# Patient Record
Sex: Male | Born: 1985 | Race: White | Hispanic: Yes | Marital: Married | Smoking: Former smoker
Health system: Southern US, Community
[De-identification: ages and names within clinical notes are randomized; demographics above are authoritative.]

## PROBLEM LIST (undated history)

## (undated) ENCOUNTER — Ambulatory Visit

## (undated) DIAGNOSIS — B699 Cysticercosis, unspecified: Secondary | ICD-10-CM

## (undated) HISTORY — DX: Cysticercosis, unspecified: B69.9

---

## 2005-10-19 ENCOUNTER — Emergency Department (HOSPITAL_COMMUNITY): Admission: EM | Admit: 2005-10-19 | Discharge: 2005-10-19 | Payer: Self-pay | Admitting: Emergency Medicine

## 2009-03-21 ENCOUNTER — Emergency Department (HOSPITAL_COMMUNITY): Admission: EM | Admit: 2009-03-21 | Discharge: 2009-03-22 | Payer: Self-pay | Admitting: Emergency Medicine

## 2009-03-26 ENCOUNTER — Ambulatory Visit: Payer: Self-pay | Admitting: Family Medicine

## 2009-03-26 ENCOUNTER — Ambulatory Visit: Payer: Self-pay | Admitting: Infectious Diseases

## 2009-03-26 ENCOUNTER — Inpatient Hospital Stay (HOSPITAL_COMMUNITY): Admission: EM | Admit: 2009-03-26 | Discharge: 2009-03-29 | Payer: Self-pay | Admitting: Family Medicine

## 2009-03-26 ENCOUNTER — Encounter: Payer: Self-pay | Admitting: Family Medicine

## 2009-03-26 DIAGNOSIS — B699 Cysticercosis, unspecified: Secondary | ICD-10-CM | POA: Insufficient documentation

## 2009-04-18 ENCOUNTER — Ambulatory Visit: Payer: Self-pay | Admitting: Infectious Diseases

## 2010-07-03 ENCOUNTER — Emergency Department (HOSPITAL_COMMUNITY): Admission: EM | Admit: 2010-07-03 | Discharge: 2010-07-04 | Payer: Self-pay | Admitting: Emergency Medicine

## 2010-07-18 ENCOUNTER — Encounter: Admission: RE | Admit: 2010-07-18 | Discharge: 2010-07-18 | Payer: Self-pay | Admitting: Emergency Medicine

## 2010-12-31 ENCOUNTER — Encounter: Payer: Self-pay | Admitting: Emergency Medicine

## 2011-01-10 NOTE — Assessment & Plan Note (Signed)
Summary: hsfu need chart neurocystercercosis   Vital Signs:  Patient profile:   25 year old male Height:      65 inches (165.10 cm) Weight:      149.5 pounds (67.95 kg) BMI:     24.97 Temp:     97.1 degrees F (36.17 degrees C) oral Pulse rate:   61 / minute BP sitting:   118 / 70  (right arm)  Vitals Entered By: Baxter Hire) (Apr 18, 2009 11:49 AM) CC: hsfu Is Patient Diabetic? No Pain Assessment Patient in pain? no      Nutritional Status BMI of 19 -24 = normal Nutritional Status Detail good  Have you ever been in a relationship where you felt threatened, hurt or afraid?Unable to ask   Does patient need assistance? Functional Status Self care Ambulation Normal   CC:  hsfu.  History of Present Illness: 25 yo M  presented to the [facility]  on March 26, 2009 with an 8 day history of numbness in the left side of his body.  He had no fever or chills.  He did have a frontal headache but no change in his vision.  He also noted a nonspecific change in his strength at that time.  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  He had a PPD placed that was negative. He serum cysticercosis antibodies were negative. he was started on albendazole as well as dexamethasone.  He had a HIV test which was non-reactive.  He was seen by neurology and started on anti-seizure prophylaxis.  He completed 10 days of albendazole and is now here in infectious disease clinic for follow-up.  Today states that he has no further numbness (this resolved in the hospital).  No further headache. He has completed his his steroids and his albendazole and remains only on tegretol.    Preventive Screening-Counseling & Management     Alcohol drinks/day: 0     Smoking Status: never     Caffeine use/day: 4 glasses of sweet tea per day     Does Patient Exercise: yes     Type of exercise: walks alot at work     Exercise (avg: min/session): >60     Times/week: 5     Seat Belt Use:  yes      Drug Use:  no.    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  Social History:    Never Smoked    Alcohol use-no    Drug use-no    grew up in [place], in [place] since [place].     Occupation: Holiday representative.     Drug Use:  no  Review of Systems       no problems with vision, has returned to work.   Physical Exam  General:  well-developed, well-nourished, and well-hydrated.   Eyes:  pupils equal, pupils round, and pupils reactive to  light.   Mouth:  pharynx pink and moist and no exudates.   Neck:  no masses.   Lungs:  normal respiratory effort and normal breath sounds.   Heart:  normal rate, regular rhythm, and no murmur.   Abdomen:  soft, non-tender, and normal bowel sounds.   Neurologic:  alert & oriented X3 and cranial nerves II-XII intact.     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 His signs suggested neurocysticersosis as did his radiographs. Would consider repeating his scans to asses the response to therapy. will defer this neuorology when he follows up with them, expect that he will have his tegretol d/c'ed then. I asked him to call if he has any further problems. Will see him back on a as needed.   Orders: Est. Patient Level III (04540)  Medications Added to Medication List This Visit: 1)  Tegretol 200 Mg Tabs (Carbamazepine) .... Take 1 tablet by mouth two times a day

## 2011-01-10 NOTE — Miscellaneous (Signed)
Summary: HIPAA Restrictions  HIPAA Restrictions   Imported By: Florinda Marker 04/18/2009 16:31:05  _____________________________________________________________________  External Attachment:    Type:   Image     Comment:   External Document

## 2011-02-24 LAB — CBC
HCT: 43.1 % (ref 39.0–52.0)
Hemoglobin: 14.8 g/dL (ref 13.0–17.0)
MCH: 30.8 pg (ref 26.0–34.0)
MCHC: 34.3 g/dL (ref 30.0–36.0)
MCV: 89.7 fL (ref 78.0–100.0)
Platelets: 261 10*3/uL (ref 150–400)
RBC: 4.81 MIL/uL (ref 4.22–5.81)
RDW: 12.8 % (ref 11.5–15.5)
WBC: 9.8 10*3/uL (ref 4.0–10.5)

## 2011-02-24 LAB — URINALYSIS, ROUTINE W REFLEX MICROSCOPIC
Bilirubin Urine: NEGATIVE
Glucose, UA: NEGATIVE mg/dL
Hgb urine dipstick: NEGATIVE
Ketones, ur: NEGATIVE mg/dL
Nitrite: NEGATIVE
Protein, ur: NEGATIVE mg/dL
Specific Gravity, Urine: 1.013 (ref 1.005–1.030)
Urobilinogen, UA: 0.2 mg/dL (ref 0.0–1.0)
pH: 7.5 (ref 5.0–8.0)

## 2011-02-24 LAB — DIFFERENTIAL
Basophils Absolute: 0 10*3/uL (ref 0.0–0.1)
Basophils Relative: 0 % (ref 0–1)
Eosinophils Absolute: 0 10*3/uL (ref 0.0–0.7)
Eosinophils Relative: 0 % (ref 0–5)
Lymphocytes Relative: 29 % (ref 12–46)
Lymphs Abs: 2.9 10*3/uL (ref 0.7–4.0)
Monocytes Absolute: 0.6 10*3/uL (ref 0.1–1.0)
Monocytes Relative: 6 % (ref 3–12)
Neutro Abs: 6.2 10*3/uL (ref 1.7–7.7)
Neutrophils Relative %: 64 % (ref 43–77)

## 2011-02-24 LAB — POCT I-STAT, CHEM 8
BUN: 10 mg/dL (ref 6–23)
Calcium, Ion: 1.17 mmol/L (ref 1.12–1.32)
Chloride: 103 mEq/L (ref 96–112)
Creatinine, Ser: 1 mg/dL (ref 0.4–1.5)
Glucose, Bld: 102 mg/dL — ABNORMAL HIGH (ref 70–99)
HCT: 46 % (ref 39.0–52.0)
Hemoglobin: 15.6 g/dL (ref 13.0–17.0)
Potassium: 3.3 mEq/L — ABNORMAL LOW (ref 3.5–5.1)
Sodium: 141 mEq/L (ref 135–145)
TCO2: 27 mmol/L (ref 0–100)

## 2011-03-21 LAB — COMPREHENSIVE METABOLIC PANEL
ALT: 17 U/L (ref 0–53)
AST: 24 U/L (ref 0–37)
Albumin: 4.1 g/dL (ref 3.5–5.2)
Alkaline Phosphatase: 63 U/L (ref 39–117)
BUN: 8 mg/dL (ref 6–23)
CO2: 28 mEq/L (ref 19–32)
Calcium: 8.9 mg/dL (ref 8.4–10.5)
Chloride: 102 mEq/L (ref 96–112)
Creatinine, Ser: 0.75 mg/dL (ref 0.4–1.5)
GFR calc Af Amer: >60 mL/min (ref >60)
GFR calc non Af Amer: >60 mL/min (ref >60)
Glucose, Bld: 98 mg/dL (ref 70–99)
Potassium: 3.6 mEq/L (ref 3.5–5.1)
Sodium: 136 mEq/L (ref 135–145)
Total Bilirubin: 0.6 mg/dL (ref 0.3–1.2)
Total Protein: 7.1 g/dL (ref 6.0–8.3)

## 2011-03-21 LAB — BASIC METABOLIC PANEL
BUN: 7 mg/dL (ref 6–23)
CO2: 28 mEq/L (ref 19–32)
Calcium: 8.7 mg/dL (ref 8.4–10.5)
Chloride: 104 mEq/L (ref 96–112)
Creatinine, Ser: 0.8 mg/dL (ref 0.4–1.5)
GFR calc Af Amer: >60 mL/min (ref >60)
GFR calc non Af Amer: >60 mL/min (ref >60)
Glucose, Bld: 115 mg/dL — ABNORMAL HIGH (ref 70–99)
Potassium: 3.4 mEq/L — ABNORMAL LOW (ref 3.5–5.1)
Sodium: 139 mEq/L (ref 135–145)

## 2011-03-21 LAB — POCT I-STAT, CHEM 8
BUN: 14 mg/dL (ref 6–23)
Calcium, Ion: 1.11 mmol/L — ABNORMAL LOW (ref 1.12–1.32)
Chloride: 101 mEq/L (ref 96–112)
Creatinine, Ser: 1 mg/dL (ref 0.4–1.5)
Glucose, Bld: 91 mg/dL (ref 70–99)
HCT: 47 % (ref 39.0–52.0)
Hemoglobin: 16 g/dL (ref 13.0–17.0)
Potassium: 3.3 mEq/L — ABNORMAL LOW (ref 3.5–5.1)
Sodium: 139 mEq/L (ref 135–145)
TCO2: 26 mmol/L (ref 0–100)

## 2011-03-21 LAB — CBC
HCT: 44.7 % (ref 39.0–52.0)
Hemoglobin: 15.3 g/dL (ref 13.0–17.0)
MCHC: 34.2 g/dL (ref 30.0–36.0)
MCV: 88.8 fL (ref 78.0–100.0)
Platelets: 293 10*3/uL (ref 150–400)
RBC: 5.03 MIL/uL (ref 4.22–5.81)
RDW: 13.6 % (ref 11.5–15.5)
WBC: 9.8 10*3/uL (ref 4.0–10.5)

## 2011-03-21 LAB — MISCELLANEOUS TEST

## 2011-03-21 LAB — HIV ANTIBODY (ROUTINE TESTING W REFLEX): HIV: NONREACTIVE

## 2011-04-24 NOTE — Discharge Summary (Signed)
NAMEJOMEL, WHITTLESEY NO.:  [id number]   MEDICAL RECORD NO.:  [id number]          PATIENT TYPE:  INP   LOCATION:  5507                         FACILITY:  [facility]   PHYSICIAN:  Santiago Bumpers. 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   DISCHARGE MEDICATIONS:  1. Tegretol 200 mg p.o. b.i.d.  2. Decadron 7 mg p.o. daily x7 days to complete a 10-day course and      then wean per primary care physician.  3. Albendazole 400 mg p.o. b.i.d. x7 days to complete a 10-day course.   CONSULTS:  Infectious Disease and Neurology.   PROCEDURES:  CT of the head without contrast on March 26, 2009.   Impression:  A 3 mm round calcification is associated with a 2.6 cm  flame-shaped area of brain edema in the right frontal parietal region.   MRA head with and without contrast.   Impression:  Abnormality posterior superior right periopercular region  suggestive of cysticercosis.   Chest x-ray on March 27, 2009.   Impression:  1. No radiographic evidence of acute cardiopulmonary disease or active      tuberculosis.  2. Increased density on the lateral view overlying the trachea as      described above.  Follow up with CT preferred.   CT of the chest with contrast on March 28, 2009.   Impression:  1. No worrisome pulmonary nodule or significant abnormal finding to      correspond to the density on prior chest radiograph.  I suspect      that this may be a slightly prominent azygous vein.  2. Small calcified right upper lobe granuloma is incidentally noted.   Bilateral femur x-rays.  This was done on March 28, 2009.   Impression:  Normal femur films bilaterally.   EEG was normal.   LABORATORIES:  CBC within normal limits.  CMP within normal limits.  HIV  antibody nonreactive.   PPD negative.  Theora Gianotti pending.   ASSESSMENT AND PLAN:  [name] is a pleasant 25 year old male native  of [place] has not been back in 12-13 years, that presented with left-  sided numbness.  Please see H and P for full details.  Subsequent  testing including MRI and CT are suspicious for neurocysticercosis in  the right frontal-parietal region.  Infectious Disease and Neurology  were consulted for recommendations.  The patient was treated with  albendazole and Decadron with a plan for a 10-day course of each and  then steroid taper.  Tegretol was also added for seizure prophylaxis.  The patient will follow up with Neurology on May 10, 2009, with Dr. Terrace Arabia  at 2:30 p.m.  He also has a follow up appointment set up for Friday,  April 01, 2009, at 9:45 a.m. with his primary care physician at [facility].  An EEG, CT of the chest, and bilateral femur x-rays were all normal.  An  Theora Gianotti is  pending.  Dr. Ninetta Lights with Infectious Disease was also happy to  see the patient in followup, in 2 weeks and provided a phone number, if  needed.  The patient continued to have intermittent numbness in his left  leg and arm that was improved prior to discharge.  An EEG was negative  for seizure focus, but the patient was also discharged on Tegretol 200  mg b.i.d. for seizure prophylaxis and this will be adjusted by his  neurologist.  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  He was provided a note for his  work.   Please fax these follow up issues:  1. Wean Decadron after 10 days.  2. Continue Tegretol for seizure prophylaxis.  3. Continue albendazole for 10-day course.  4. Follow up with Neurology on May 15, 2009, at 2:30 p.m.  5. Follow up with primary care physician on April 01, 2009, at 9:45      a.m.      [name], MD  Electronically Signed      Santiago Bumpers. Leveda Anna, M.D.   Electronically Signed    EW/MEDQ  D:  03/29/2009  T:  03/30/2009  Job:  [id number]   cc:   Levert Feinstein, MD  Lacretia Leigh. Ninetta Lights, M.D.  Elvina Sidle, M.D.

## 2011-04-24 NOTE — Consult Note (Signed)
NAMEHAYLEN, [name] NO.:  [id number]   MEDICAL RECORD NO.:  [id number]          PATIENT TYPE:  INP   LOCATION:  5507                         FACILITY:  [facility]   PHYSICIAN:  Noel Christmas, MD    DATE OF BIRTH:  06-02-1986   DATE OF CONSULTATION:  03/26/2009  DATE OF DISCHARGE:                                 CONSULTATION   REFERRING PHYSICIAN:  Paula Compton, MD   REASON FOR CONSULTATION:  Abnormal CT scan suggestive of  neurocysticercosis or possible tumor.   HISTORY OF PRESENT ILLNESS:  This is a 25 year old man who presented  with a history of recurrent numbness involving his left face, arm and  leg intermittently for about 1 week.  Location as well as intensity of  numbness is varied from time-to-time, but all involving left side.  He  has had no speech changes.  He has had only minimal headache and no  dizziness.  There has been no change in his vision nor in his balance.  He has had no coordination difficulty involving his left extremities nor  any left-sided weakness.  CT scan of his head showed a 3-mm right  frontoparietal small calcified lesion with an area of lower density  anteriorly measuring about 26 x 18 mm, thought to represent edema.  The  patient is a native of [place].  He has lived in the [place] for the last 13-  14 years and has not traveled back to [place] at anytime.   PAST MEDICAL HISTORY:  Unremarkable.  He has no known systemic disease  and takes no medications on a routine basis.   FAMILY HISTORY:  Noncontributory.   PHYSICAL EXAMINATION:  GENERAL APPEARANCE:  A slender young male who  appears to be of stated age.  He was alert and cooperative, and in no  acute distress.  MENTAL STATUS:  Normal.  HEENT:  Pupils were equal and reactive normally to light.  Extraocular  movements, visual fields and fundi were normal.  There was no facial  numbness or facial weakness.  Hearing was normal.  Speech and palatal  movement were normal.   Coordination of extremities was normal.  Strength  and muscle tone were normal throughout.  Deep tendon reflexes were  normal and symmetrical throughout.  Plantar responses were flexor.  Sensory exam was normal.  Carotid auscultation was normal.   CLINICAL IMPRESSION:  Abnormal structural lesion involving his right  frontoparietal area of unclear etiology.  Lesion may be of parasitic  origin.  However, neoplasm cannot be ruled out at this point.   RECOMMENDATIONS:  1. Further evaluation with MRI of the brain without and with contrast.  2. Infectious Diseases Service consult.  3. No seizure prophylaxis indicated at this point.   Thank you for asking me to evaluate [name].      Noel Christmas, MD  Electronically Signed     CS/MEDQ  D:  03/26/2009  T:  03/27/2009  Job:  [id number]

## 2011-04-24 NOTE — Procedures (Signed)
EEG NUMBER:  [id number]   REQUESTING PHYSICIAN:  Noel Christmas, MD   CLINICAL HISTORY:  A 25 year old man with intermittent left-sided  weakness and CT scan suspicious for neurocysticercosis.  EEG is  performed for evaluation.  This is a portable EEG done at the bedside.  The patient is described as awake and drowsy.   DESCRIPTION:  The dominant rhythm in this tracing is a moderate-to-high  amplitude alpha rhythm of 10-11 Hz, which predominates posteriorly,  appears without abnormal asymmetry, and attenuates with eye opening and  closing.  Low amplitude fast activity seen frontally and centrally and  appears without abnormal asymmetry.  No focal slowing is noted and no  epileptiform discharges were seen.  Drowsiness occurs naturally as  evidenced by fragmentation of the background and general attenuation of  rhythms.  The patient progresses quickly in the stage II sleep, where  vertex waves, which were sometimes sharply contoured, are seen, along  with sleep spindles and K complexes.  No focal abnormalities were seen  in sleep.  Photic stimulation produced symmetric driving responses.  Hyperventilation produced no significant change in the background  rhythms.  Single channel devoted to EKG revealed sinus rhythm throughout  with a rate of approximately 72 beats minute.   CONCLUSION:  Normal study in awake, drowsy, and sleep states.      Michael L. Thad Ranger, M.D.  Electronically Signed     ONG:EXBM  D:  03/28/2009 17:30:21  T:  03/29/2009 05:11:21  Job #:  [id number]

## 2011-04-24 NOTE — H&P (Signed)
[name], [name] NO.:  [id number]   MEDICAL RECORD NO.:  [id number]          PATIENT TYPE:  INP   LOCATION:  3010                         FACILITY:  [facility]   PHYSICIAN:  Paula Compton, MD        DATE OF BIRTH:  [birth year]   DATE OF ADMISSION:  03/26/2009  DATE OF DISCHARGE:                              HISTORY & PHYSICAL   PCP:  [place].   CHIEF COMPLAINT:  Left-sided weakness.   HISTORY OF PRESENT ILLNESS:  [name] is a 25 year old with no past  medical history, originally from [place], who presented with left-sided  face, arm and leg numbness that was described as intermittent x1 week  with weakness as well on his left side.  CT scan showed a 3-mm round  calcification associated with a 2.6-cm flame-shaped area of brain edema  in the right frontal parietal region with a differential including  neurocysticercosis neoplasm and AVM.  The patient, again, is originally  from [place] but denies being back in 13-14 years.  He has no history of  recent pork ingestion.  He denies any seizure activity.  He endorses a  mild headache that is not different from previous headaches in the past.   PAST MEDICAL HISTORY:  None.   PAST SURGICAL HISTORY:  None.   MEDICATIONS:  None.   ALLERGIES:  None.   SOCIAL HISTORY:  He lives in [place] with his wife.  He works in  Holiday representative.  Denies tobacco, alcohol and drugs.   Has no family history diabetes or cancer.   REVIEW OF SYSTEMS:  He denies fevers, chills, sweats, fatigue, chest  pain, cough, wheezing, sputum, nausea, vomiting, diarrhea, abdominal  pain, rashes, myalgias, arthralgias, swelling, visual changes.   On physical exam, temperature 98.2, pulse 54, respirations 16, blood  pressure 111/70, pulse ox 100% on room air.  Weight is 67.132 kg.  GENERAL:  He is alert and oriented x3, in no apparent distress.  HEENT:  Normocephalic, atraumatic.  Pupils equal, round and reactive to  light.  Extraocular movements  are intact.  Moist mucous membranes.  NECK:  Supple.  Has full range of motion.  CV:  Regular rate and rhythm.  No murmurs, rubs or gallops.  LUNGS:  Clear to auscultation bilaterally.  ABDOMEN:  Soft, nontender, nondistended, with bowel sounds x4.  EXTREMITIES:  No cyanosis, clubbing or edema.  NEUROLOGICAL EXAM:  Cranial nerves II-XII are intact.  DTRs, as  mentioned, are equal, 2/4 bilaterally.  Upper extremities and lower  extremities:  He has decreased sensation in the left arm and left leg.  Strength is normal in all extremities.  SKIN:  No rashes.   LABORATORY STUDIES:  None from today but he had a recent BMET from March 23, 2009 with a sodium of 139, potassium 3.3, chloride 101, CO2 26, BUN  of 14, creatinine of 1.0, glucose 91.   MINI MENTAL STATUS EXAM:  Normal.   CT of the head is positive for a 3-mm round calcification associated  with a 2.6 cm flamed-shaped area of brain edema in the right  frontal  parietal region.  Differential diagnosis includes infectious  cysticercosis, neoplasm, AVM.   ASSESSMENT AND PLAN:  [name] is a 25 year old male with no past  medical history, originally from [place], who presented with left-sided  numbness and weakness as well as a computerized tomography scan  suspicious for infectious cysticercosis.  1. Neuro.  Please see the computerized tomography results above.      Patient with a differential diagnosis of infectious cysticercosis,      neoplasm (astrocytoma) and arteriovenous malformation.  The patient      has no focal neurological deficits on exam.  He complains of no      vision changes.  Review of systems is positive for numbness of his      left side that is intermittent.  We did discuss the case with      radiology, infectious disease and neurology for followup.  We did      not do a funduscopic exam at this time.  We will start prednisone      60 daily and await recommendations by specialists. PPD placement,      check HIV  as part of initial workup.  2. Fluids, electrolytes, nutrition and gastrointestinal:  Regular      diet.  Hep-Lock intravenously.  3. Prophylaxis:  Ambulation.  No need for seizure precautions at this      time.  4. Disposition pending workup.      [name], MD  Electronically Signed      Paula Compton, MD  Electronically Signed    EW/MEDQ  D:  03/26/2009  T:  03/26/2009  Job:  [id number]

## 2012-09-23 ENCOUNTER — Ambulatory Visit (INDEPENDENT_AMBULATORY_CARE_PROVIDER_SITE_OTHER): Payer: BC Managed Care – PPO | Admitting: Family Medicine

## 2012-09-23 VITALS — BP 118/72 | HR 62 | Temp 98.1°F | Resp 20 | Ht 64.5 in | Wt 145.4 lb

## 2012-09-23 DIAGNOSIS — Z0000 Encounter for general adult medical examination without abnormal findings: Secondary | ICD-10-CM

## 2012-09-23 DIAGNOSIS — Z23 Encounter for immunization: Secondary | ICD-10-CM

## 2012-09-23 DIAGNOSIS — Z283 Underimmunization status: Secondary | ICD-10-CM

## 2012-09-23 DIAGNOSIS — Z113 Encounter for screening for infections with a predominantly sexual mode of transmission: Secondary | ICD-10-CM

## 2012-09-23 DIAGNOSIS — Z2839 Other underimmunization status: Secondary | ICD-10-CM

## 2012-09-23 LAB — COMPREHENSIVE METABOLIC PANEL
ALT: 16 U/L (ref 0–53)
AST: 21 U/L (ref 0–37)
Albumin: 5.1 g/dL (ref 3.5–5.2)
Alkaline Phosphatase: 52 U/L (ref 39–117)
BUN: 12 mg/dL (ref 6–23)
CO2: 29 mEq/L (ref 19–32)
Calcium: 9.9 mg/dL (ref 8.4–10.5)
Chloride: 103 mEq/L (ref 96–112)
Creat: 0.82 mg/dL (ref 0.50–1.35)
Glucose, Bld: 82 mg/dL (ref 70–99)
Potassium: 4.2 mEq/L (ref 3.5–5.3)
Sodium: 140 mEq/L (ref 135–145)
Total Bilirubin: 0.6 mg/dL (ref 0.3–1.2)
Total Protein: 7.7 g/dL (ref 6.0–8.3)

## 2012-09-23 LAB — POCT CBC
Granulocyte percent: 55.5 %G (ref 37–80)
HCT, POC: 50.4 % (ref 43.5–53.7)
Hemoglobin: 16.2 g/dL (ref 14.1–18.1)
Lymph, poc: 3.4 (ref 0.6–3.4)
MCH, POC: 29 pg (ref 27–31.2)
MCHC: 32.1 g/dL (ref 31.8–35.4)
MCV: 90.4 fL (ref 80–97)
MID (cbc): 0.8 (ref 0–0.9)
MPV: 8.4 fL (ref 0–99.8)
POC Granulocyte: 5.3 (ref 2–6.9)
POC LYMPH PERCENT: 35.9 %L (ref 10–50)
POC MID %: 8.6 %M (ref 0–12)
Platelet Count, POC: 344 10*3/uL (ref 142–424)
RBC: 5.58 M/uL (ref 4.69–6.13)
RDW, POC: 13.5 %
WBC: 9.6 10*3/uL (ref 4.6–10.2)

## 2012-09-23 LAB — LIPID PANEL
Cholesterol: 130 mg/dL (ref 0–200)
HDL: 47 mg/dL (ref >39)
LDL Cholesterol: 75 mg/dL (ref 0–99)
Total CHOL/HDL Ratio: 2.8 Ratio
Triglycerides: 39 mg/dL (ref <150)
VLDL: 8 mg/dL (ref 0–40)

## 2012-09-23 LAB — HIV ANTIBODY (ROUTINE TESTING W REFLEX): HIV: NONREACTIVE

## 2012-09-23 LAB — RPR

## 2012-09-23 NOTE — Progress Notes (Signed)
  [facility]:  Office Visit  Chief Complaint:  Chief Complaint  Patient presents with  . Annual Exam    HPI: [name] is a 26 y.o. male who complains of  Doing well. No complaints. Has a newborn 37 months old not UTD on TDaP. Would like flu vaccine.  History reviewed. No pertinent past medical history. History reviewed. No pertinent past surgical history. History   Social History  . Marital Status: Single    Spouse Name: N/A    Number of Children: N/A  . Years of Education: N/A   Social History Main Topics  . Smoking status: Former Smoker    Types: Cigarettes    Quit date: 07/24/2012  . Smokeless tobacco: Never Used  . Alcohol Use: No  . Drug Use: No  . Sexually Active: Yes   Other Topics Concern  . None   Social History Narrative  . None   Family History  Problem Relation Age of Onset  . Hypertension Mother    Not on File Prior to Admission medications   Not on File     ROS: The patient denies fevers, chills, night sweats, unintentional weight loss, chest pain, palpitations, wheezing, dyspnea on exertion, nausea, vomiting, abdominal pain, dysuria, hematuria, melena, numbness, weakness, or tingling.   All other systems have been reviewed and were otherwise negative with the exception of those mentioned in the HPI and as above.    PHYSICAL EXAM: Filed Vitals:   09/23/12 1409  BP: 118/72  Pulse: 62  Temp: 98.1 F (36.7 C)  Resp: 20   Filed Vitals:   09/23/12 1409  Height: 5' 4.5" (1.638 m)  Weight: 145 lb 6.4 oz (65.953 kg)   Body mass index is 24.57 kg/(m^2).  General: Alert, no acute distress HEENT:  Normocephalic, atraumatic, oropharynx patent. EOMI, PERRLA, fundoscopic exam nl.  Cardiovascular:  Regular rate and rhythm, no rubs murmurs or gallops.  No Carotid bruits, radial pulse intact. No pedal edema.  Respiratory: Clear to auscultation bilaterally.  No wheezes, rales, or rhonchi.  No cyanosis, no use of accessory  musculature GI: No organomegaly, abdomen is soft and non-tender, positive bowel sounds.  No masses. Skin: No rashes. Neurologic: Facial musculature symmetric. Psychiatric: Patient is appropriate throughout our interaction. Lymphatic: No cervical lymphadenopathy Musculoskeletal: Gait intact. GU-uncircumcised. Normal testicles and scrotum. No hernia   LABS:    EKG/XRAY:   Primary read interpreted by Dr. Conley Rolls at [facility].   ASSESSMENT/PLAN: Encounter Diagnoses  Name Primary?  . Annual physical exam Yes  . Immunization deficiency   . Screening for STD (sexually transmitted disease)    Labs pending Flu vaccine given TDaP given    LE, THAO PHUONG, DO 09/23/2012 2:38 PM

## 2012-09-24 LAB — GC/CHLAMYDIA PROBE AMP, URINE
Chlamydia, Swab/Urine, PCR: NEGATIVE
GC Probe Amp, Urine: NEGATIVE

## 2012-09-24 LAB — HSV(HERPES SIMPLEX VRS) I + II AB-IGG
HSV 1 Glycoprotein G Ab, IgG: 9.58 IV — ABNORMAL HIGH
HSV 2 Glycoprotein G Ab, IgG: <0.1 IV

## 2012-10-10 ENCOUNTER — Encounter: Payer: Self-pay | Admitting: Family Medicine

## 2012-10-24 ENCOUNTER — Ambulatory Visit (INDEPENDENT_AMBULATORY_CARE_PROVIDER_SITE_OTHER): Payer: BC Managed Care – PPO | Admitting: Family Medicine

## 2012-10-24 VITALS — BP 114/70 | HR 65 | Temp 98.5°F | Resp 16 | Ht 64.5 in | Wt 152.0 lb

## 2012-10-24 DIAGNOSIS — Z23 Encounter for immunization: Secondary | ICD-10-CM

## 2012-10-24 NOTE — Progress Notes (Signed)
[facility]:  Office Visit  Chief Complaint:  Chief Complaint  Patient presents with  . Immunizations    HPI: [name] is a 26 y.o. male who complains of  Needs MMR for immigration paperwork. NO fevers, chills, egg allergies, reactions to other vaccinations  Past Medical History  Diagnosis Date  . 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 No pertinent past surgical history. History   Social History  . Marital Status: Single    Spouse Name: N/A    Number of Children: N/A  . Years of Education: N/A   Social History Main Topics  . Smoking status: Former Smoker    Types: Cigarettes    Quit date: 07/24/2012  . Smokeless tobacco: Never Used  . Alcohol Use: No  . Drug Use: No  . Sexually Active: Yes   Other Topics Concern  . None   Social History Narrative  . None   Family History  Problem Relation Age of Onset  . Hypertension Mother    No Known Allergies Prior to Admission medications   Not on File     ROS: The patient denies fevers, chills, night sweats, unintentional weight loss, chest pain, palpitations, wheezing, dyspnea on exertion, nausea, vomiting, abdominal pain, dysuria, hematuria, melena, numbness, weakness, or tingling. All other systems have been reviewed and were otherwise negative with the exception of those mentioned in the HPI and as above.    PHYSICAL EXAM: Filed Vitals:   10/24/12 1815  BP: 114/70  Pulse: 65  Temp: 98.5 F (36.9 C)  Resp: 16   Filed Vitals:   10/24/12 1815  Height: 5' 4.5" (1.638 m)  Weight: 152 lb (68.947 kg)   Body mass index is 25.69 kg/(m^2).  General: Alert, no acute distress HEENT:  Normocephalic, atraumatic, oropharynx patent.  Cardiovascular:  Regular rate and rhythm, no rubs murmurs or gallops.  No Carotid bruits, radial pulse intact. No pedal edema.  Respiratory: Clear to auscultation bilaterally.  No wheezes,  rales, or rhonchi.  No cyanosis, no use of accessory musculature GI: No organomegaly, abdomen is soft and non-tender, positive bowel sounds.  No masses. Skin: No rashes. Neurologic: Facial musculature symmetric. Psychiatric: Patient is appropriate throughout our interaction. Lymphatic: No cervical lymphadenopathy Musculoskeletal: Gait intact.   LABS: Results for orders placed in visit on 09/23/12  POCT CBC      Component Value Range   WBC 9.6  4.6 - 10.2 K/uL   Lymph, poc 3.4  0.6 - 3.4   POC LYMPH PERCENT 35.9  10 - 50 %L   MID (cbc) 0.8  0 - 0.9   POC MID % 8.6  0 - 12 %M   POC Granulocyte 5.3  2 - 6.9   Granulocyte percent 55.5  37 - 80 %G   RBC 5.58  4.69 - 6.13 M/uL   Hemoglobin 16.2  14.1 - 18.1 g/dL   HCT, POC 47.8  29.5 - 53.7 %   MCV 90.4  80 - 97 fL   MCH, POC 29.0  27 - 31.2 pg   MCHC 32.1  31.8 - 35.4 g/dL   RDW, POC 62.1     Platelet Count, POC 344  142 - 424 K/uL   MPV 8.4  0 - 99.8 fL  COMPREHENSIVE METABOLIC PANEL      Component Value Range   Sodium 140  135 - 145 mEq/L   Potassium 4.2  3.5 - 5.3 mEq/L   Chloride 103  96 - 112 mEq/L   CO2 29  19 - 32 mEq/L   Glucose, Bld 82  70 - 99 mg/dL   BUN 12  6 - 23 mg/dL   Creat 1.61  0.96 - 0.45 mg/dL   Total Bilirubin 0.6  0.3 - 1.2 mg/dL   Alkaline Phosphatase 52  39 - 117 U/L   AST 21  0 - 37 U/L   ALT 16  0 - 53 U/L   Total Protein 7.7  6.0 - 8.3 g/dL   Albumin 5.1  3.5 - 5.2 g/dL   Calcium 9.9  8.4 - 40.9 mg/dL  LIPID PANEL      Component Value Range   Cholesterol 130  0 - 200 mg/dL   Triglycerides 39  <811 mg/dL   HDL 47  >91 mg/dL   Total CHOL/HDL Ratio 2.8     VLDL 8  0 - 40 mg/dL   LDL Cholesterol 75  0 - 99 mg/dL  GC/CHLAMYDIA PROBE AMP, URINE      Component Value Range   Chlamydia, Swab/Urine, PCR NEGATIVE  NEGATIVE   GC Probe Amp, Urine NEGATIVE  NEGATIVE  RPR      Component Value Range   RPR NON REAC  NON REAC  HSV(HERPES SIMPLEX VRS) I + II AB-IGG      Component Value Range   HSV 1  Glycoprotein G Ab, IgG 9.58 (*)    HSV 2 Glycoprotein G Ab, IgG <0.10    HIV ANTIBODY (ROUTINE TESTING)      Component Value Range   HIV NON REACTIVE  NON REACTIVE     EKG/XRAY:   Primary read interpreted by Dr. Conley Rolls at [facility].   ASSESSMENT/PLAN: Encounter Diagnosis  Name Primary?  . Need for prophylactic vaccination with measles-mumps-rubella (MMR) vaccine Yes   Needs MMR vaccine Request that we fax vaccine records to MD in [place] today during office visit Went through labs one more time with patient and girlfriend who is in room F/u prn    Navdeep Fessenden PHUONG, DO 10/28/2012 11:03 AM

## 2012-10-28 ENCOUNTER — Encounter: Payer: Self-pay | Admitting: Family Medicine

## 2013-05-19 DIAGNOSIS — J029 Acute pharyngitis, unspecified: Secondary | ICD-10-CM | POA: Insufficient documentation

## 2013-05-19 DIAGNOSIS — J069 Acute upper respiratory infection, unspecified: Secondary | ICD-10-CM | POA: Insufficient documentation

## 2013-05-19 DIAGNOSIS — R093 Abnormal sputum: Secondary | ICD-10-CM | POA: Insufficient documentation

## 2013-05-19 DIAGNOSIS — R05 Cough: Secondary | ICD-10-CM | POA: Insufficient documentation

## 2013-05-19 DIAGNOSIS — R059 Cough, unspecified: Secondary | ICD-10-CM | POA: Insufficient documentation

## 2013-05-19 DIAGNOSIS — IMO0001 Reserved for inherently not codable concepts without codable children: Secondary | ICD-10-CM | POA: Insufficient documentation

## 2013-05-20 ENCOUNTER — Encounter (HOSPITAL_COMMUNITY): Payer: Self-pay | Admitting: *Deleted

## 2013-05-20 ENCOUNTER — Emergency Department (HOSPITAL_COMMUNITY)
Admission: EM | Admit: 2013-05-20 | Discharge: 2013-05-20 | Disposition: A | Discharge status: Home / Self Care | Payer: BC Managed Care – PPO | Attending: Emergency Medicine | Admitting: Emergency Medicine

## 2013-05-20 NOTE — ED Notes (Signed)
Pt reports sore throat that began today approx 11am - pt also admits to x2 episodes of vomiting approx 2hrs ago, denies any nausea at present or abd pain. Denies any known fever as well.

## 2013-05-28 NOTE — ED Provider Notes (Signed)
Please see my downtime documentation from this date.    Olivia Mackie, MD 05/28/13 2053

## 2013-09-16 ENCOUNTER — Emergency Department (HOSPITAL_COMMUNITY)
Admission: EM | Admit: 2013-09-16 | Discharge: 2013-09-16 | Disposition: A | Discharge status: Home / Self Care | Payer: BC Managed Care – PPO | Attending: Emergency Medicine | Admitting: Emergency Medicine

## 2013-09-16 ENCOUNTER — Encounter (HOSPITAL_COMMUNITY): Payer: Self-pay | Admitting: Emergency Medicine

## 2013-09-16 ENCOUNTER — Emergency Department (HOSPITAL_COMMUNITY): Payer: BC Managed Care – PPO

## 2013-09-16 DIAGNOSIS — R0602 Shortness of breath: Secondary | ICD-10-CM | POA: Insufficient documentation

## 2013-09-16 DIAGNOSIS — R61 Generalized hyperhidrosis: Secondary | ICD-10-CM | POA: Insufficient documentation

## 2013-09-16 DIAGNOSIS — R51 Headache: Secondary | ICD-10-CM | POA: Insufficient documentation

## 2013-09-16 DIAGNOSIS — Z8619 Personal history of other infectious and parasitic diseases: Secondary | ICD-10-CM | POA: Insufficient documentation

## 2013-09-16 DIAGNOSIS — R0789 Other chest pain: Secondary | ICD-10-CM | POA: Insufficient documentation

## 2013-09-16 DIAGNOSIS — R002 Palpitations: Secondary | ICD-10-CM | POA: Insufficient documentation

## 2013-09-16 DIAGNOSIS — Z87891 Personal history of nicotine dependence: Secondary | ICD-10-CM | POA: Insufficient documentation

## 2013-09-16 LAB — POCT I-STAT TROPONIN I: Troponin i, poc: 0 ng/mL (ref 0.00–0.08)

## 2013-09-16 NOTE — ED Notes (Signed)
Per pt sts that he started having chest pain yesterday at work. sts the pain is intermittent. sts he feels anxious.

## 2013-09-16 NOTE — ED Provider Notes (Signed)
CSN: [id number]     Arrival date & time 09/16/13  1113 History   None    Chief Complaint  Patient presents with  . Chest Pain   (Consider location/radiation/quality/duration/timing/severity/associated sxs/prior Treatment) Patient is a 27 y.o. male presenting with chest pain. The history is provided by the patient.  Chest Pain Pain location:  Substernal area Pain quality: pressure   Pain radiates to:  Does not radiate Pain radiates to the back: no   Onset quality:  Sudden Duration:  15 minutes Timing:  Sporadic Progression:  Resolved Chronicity:  New Context: movement   Relieved by: resolved on own without intervention. 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 Had chest pressure that lasted for several minutes. He had no shortness of breath or palpitations with this episode. He continued to work and his chest pain resolved on its own. Today he noted onset of worsened chest pressure at 10:30 am while working and moving . He had shortness of breath with this episode along with palpitations and small amount of diaphoresis. There was no radiation of pressure. Pain improved on its own as the patient was driving to the ED. Patient denies anxiety with this episode.  Past Medical History  Diagnosis Date  . 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 No pertinent past surgical history. Family History  Problem Relation Age of Onset  . Hypertension Mother    History  Substance Use Topics  . Smoking  status: Former Smoker    Types: Cigarettes    Quit date: 07/24/2012  . Smokeless tobacco: Never Used  . Alcohol Use: No    Review of Systems  Constitutional: Positive for diaphoresis.  Respiratory: Positive for shortness of breath (prior to arrival).   Cardiovascular: Positive for chest pain (prior to arrival) and palpitations (prior to arrival). Negative for leg swelling.  Gastrointestinal: Negative for abdominal pain.  Neurological: Positive for headaches.    Allergies  Review of patient's allergies indicates no known allergies.  Home Medications  No current outpatient prescriptions on file. BP 120/82  Pulse 78  Temp(Src) 98 F (36.7 C) (Oral)  Resp 16  Wt 144 lb 8 oz (65.545 kg)  BMI 24.79 kg/m2  SpO2 99% Physical Exam  Constitutional: He appears well-developed and well-nourished. No distress.  HENT:  Head: Normocephalic and atraumatic.  Mouth/Throat: Oropharynx is clear and moist.  Eyes: Conjunctivae are normal. Pupils are equal, round, and reactive to light.  Neck: Neck supple.  Cardiovascular: Normal rate, regular rhythm and normal heart sounds.   Pulmonary/Chest: Effort normal and breath sounds normal. No respiratory distress. He has no wheezes. He has no rales. He exhibits no tenderness.  Abdominal: Soft. He exhibits no distension. There is no tenderness.  Musculoskeletal: He exhibits no edema.  Neurological: He is alert.  Skin: Skin is warm and dry. He is not diaphoretic.    ED Course  Procedures (including critical care time) EKG with NSR, no ST or T wave abnormalities  Labs Review Labs Reviewed - No  data to display POCT troponin 0.00 (there was an issue with the POC computer input system) Imaging Review Dg Chest 2 View  09/16/2013   CLINICAL DATA:  Chest pain and SOB  EXAM: CHEST  2 VIEW  COMPARISON:  03/28/2009  FINDINGS: The heart size and mediastinal contours are within normal limits. Both lungs are clear. The visualized skeletal structures are  unremarkable.  IMPRESSION: No active cardiopulmonary disease.   Electronically Signed   By: Signa Kell M.D.   On: 09/16/2013 13:36    MDM   1. Chest pain, non-cardiac    12:00 pm: patient seen and examined. With 2 episodes of chest pain over the past 2 days. Described as pressure with shortness of breath and palpitations. EKG is NSR with no abnormalities. Patient is not in current pain. He is not tender to palpation. Concern would be for ACS or pneumothorax, though these seem unlikely given normal EKG and normal exam with stable vitals. Will obtain iStat troponin and CXR to rule out cardiac disease and pulmonary disease.   2:25 pm: troponin returned at 0.00 and CXR revealed no acute process. Patients pain is likely not related to cardiac cause given normal EKG and troponin and there is not evidence for a cause on the CXR. Potentially pain is related to MSK cause though was unable to reproduce this pain with palpation vs anxiety component though patient does not endorse this. This was discussed with the patient and he was advised to follow-up with a PCP. The resource list was given to the patient to find a PCP and this was reinforced that he needed to . Return precautions were discussed with the patient.  Marikay Alar, MD [facility] PGY-2 09/16/13 2:38 pm  Glori Luis, MD 09/16/13 1451

## 2013-09-16 NOTE — ED Notes (Signed)
Pt sts yesterday he had CP that lasted a few mins, felt like his heart was racing then the pain went away. Pt had another episode today but sts the pain was worse and then also felt SOB, sts the pain went away then came back and has gone away again. 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 Pt in nad, skin warm and dry, resp e/u.

## 2013-09-16 NOTE — ED Notes (Signed)
Pt returned from radiology.

## 2013-09-17 NOTE — ED Provider Notes (Signed)
I saw and evaluated the patient, reviewed the resident's note and I agree with the findings and plan.  This a 27 year old who presents with chest pain.  He is nontoxic-appearing on exam his vital signs within normal limits. He is currently chest pain-free. Chest pain was in the setting of lifting. Patient is young and has no risk factors for heart disease. He denies any shortness of breath. The pain is not reproducible on exam. EKG is normal. Chest x-ray is negative for pneumothorax or other abnormality. Although chest pain is not reproducible on exam, given patient's age and the fact that he was lifting something heavy during the onset of chest pain, likely musculoskeletal. Patient to followup with PCP.  After history, exam, and medical workup I feel the patient has been appropriately medically screened and is safe for discharge home. Pertinent diagnoses were discussed with the patient. Patient was given return precautions.   Shon Baton, MD 09/17/13 [phone]

## 2013-10-06 ENCOUNTER — Emergency Department (HOSPITAL_COMMUNITY)
Admission: EM | Admit: 2013-10-06 | Discharge: 2013-10-06 | Disposition: A | Discharge status: Home / Self Care | Payer: BC Managed Care – PPO | Attending: Emergency Medicine | Admitting: Emergency Medicine

## 2013-10-06 ENCOUNTER — Emergency Department (HOSPITAL_COMMUNITY): Payer: BC Managed Care – PPO

## 2013-10-06 ENCOUNTER — Encounter (HOSPITAL_COMMUNITY): Payer: Self-pay | Admitting: Emergency Medicine

## 2013-10-06 DIAGNOSIS — Z87891 Personal history of nicotine dependence: Secondary | ICD-10-CM | POA: Insufficient documentation

## 2013-10-06 DIAGNOSIS — M25511 Pain in right shoulder: Secondary | ICD-10-CM

## 2013-10-06 DIAGNOSIS — M25519 Pain in unspecified shoulder: Secondary | ICD-10-CM | POA: Insufficient documentation

## 2013-10-06 DIAGNOSIS — Z8619 Personal history of other infectious and parasitic diseases: Secondary | ICD-10-CM | POA: Insufficient documentation

## 2013-10-06 MED ORDER — DIAZEPAM 5 MG PO TABS
5.0000 mg | ORAL_TABLET | Freq: Once | ORAL | Status: AC
  Administered 2013-10-06: 5 mg via ORAL
  Filled 2013-10-06: qty 1

## 2013-10-06 MED ORDER — NAPROXEN 500 MG PO TABS: 500.0000 mg | ORAL_TABLET | Freq: Two times a day (BID) | ORAL | Status: DC

## 2013-10-06 MED ORDER — CYCLOBENZAPRINE HCL 10 MG PO TABS: 10.0000 mg | ORAL_TABLET | Freq: Two times a day (BID) | ORAL | Status: DC | PRN

## 2013-10-06 MED ORDER — KETOROLAC TROMETHAMINE 60 MG/2ML IM SOLN
60.0000 mg | Freq: Once | INTRAMUSCULAR | Status: AC
  Administered 2013-10-06: 60 mg via INTRAMUSCULAR
  Filled 2013-10-06: qty 2

## 2013-10-06 NOTE — ED Provider Notes (Signed)
CSN: [id number]     Arrival date & time 10/06/13  1248 History  This chart was scribed for non-physician practitioner, Emilia Beck, PA-C working with Junius Argyle, MD by Greggory Stallion, ED scribe. This patient was seen in room TR05C/TR05C and the patient's care was started at 2:09 PM.   Chief Complaint  Patient presents with  . Shoulder Pain   The history is provided by the patient. No language interpreter was used.   HPI Comments: [name] is a 27 y.o. male who presents to the Emergency Department complaining of gradual onset, constant aching right shoulder pain that started 4 days ago. He states he does heavy lifting at work but denies injury. Movement worsens the pain. Pt has tried ibuprofen and icy hot with little relief. He denies numbness and tingling.   Past Medical History  Diagnosis Date  . 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 No pertinent past surgical history. Family History  Problem Relation Age of Onset  . Hypertension Mother    History  Substance Use Topics  . Smoking status: Former Smoker    Types: Cigarettes    Quit date: 07/24/2012  . Smokeless tobacco: Never Used  . Alcohol Use: No    Review of Systems  Musculoskeletal: Positive for arthralgias.  Neurological: Negative for numbness.  All other systems reviewed and are negative.    Allergies  Review of patient's allergies indicates no known allergies.  Home Medications  No current outpatient prescriptions on file.  BP 117/64  Pulse 66  Temp(Src) 98.3 F (36.8 C) (Oral)  Resp 16  Ht 5\' 6"  (1.676 m)  Wt 146 lb 9.6 oz (66.497 kg)  BMI 23.67 kg/m2  SpO2 98%  Physical Exam  Nursing note and vitals reviewed. Constitutional: He is oriented to person, place, and time. He appears well-developed and well-nourished. No distress.  HENT:  Head: Normocephalic and atraumatic.  Eyes: EOM are normal.  Neck: Neck  supple. No tracheal deviation present.  Cardiovascular: Normal rate.   Pulmonary/Chest: Effort normal. No respiratory distress.  Musculoskeletal: Normal range of motion.  Anterior shoulder tenderness to palpation. No obvious deformity. ROM limited due to pain.   Neurological: He is alert and oriented to person, place, and time.  Skin: Skin is warm and dry.  Psychiatric: He has a normal mood and affect. His behavior is normal.    ED Course  Procedures (including critical care time)  DIAGNOSTIC STUDIES: Oxygen Saturation is 98% on RA, normal by my interpretation.    COORDINATION OF CARE: 2:11 PM-Discussed treatment plan which includes pain medication and a muscle relaxer with pt at bedside and pt agreed to plan. Advised pt to follow up with orthopedics if the pain does not resolve.   Labs Review Labs Reviewed - No data to display Imaging Review Dg Shoulder Right  10/06/2013   CLINICAL DATA:  Shoulder pain without history of injury.  EXAM: RIGHT SHOULDER - 2+ VIEW  COMPARISON:  None.  FINDINGS: The bones are adequately mineralized. There is no evidence of an acute fracture nor dislocation. No significant degenerative change is demonstrated. The overlying soft tissues are normal in appearance. The observed portions of the clavicle and upper right ribs appear normal.  IMPRESSION: There is no evidence of acute bony abnormality of the right shoulder.   Electronically Signed   By: David  [place]   On: 10/06/2013 13:46   EKG Interpretation   None  MDM   1. Right shoulder pain     2:18 PM Xray unremarkable for acute changes. Patient given toradol and valium for pain. Patient will be discharged with Naprosyn and Flexeril for pain. No neurovascular compromise. Patient instructed to follow up with Orthopedics if symptoms do not resolve.     I personally performed the services described in this documentation, which was scribed in my presence. The recorded information has been reviewed  and is accurate.   Emilia Beck, PA-C 10/06/13 1556

## 2013-10-06 NOTE — ED Notes (Signed)
Pt reports heavy lifting at work, having right shoulder pain since Friday. Increases with movement. No relief with ibuprofen and icy hot.

## 2013-10-07 NOTE — ED Provider Notes (Signed)
Medical screening examination/treatment/procedure(s) were performed by non-physician practitioner and as supervising physician I was immediately available for consultation/collaboration.    Zalma Channing S Daeveon Zweber, MD 10/07/13 0917 

## 2014-03-15 ENCOUNTER — Ambulatory Visit (INDEPENDENT_AMBULATORY_CARE_PROVIDER_SITE_OTHER): Payer: BC Managed Care – PPO | Admitting: Internal Medicine

## 2014-03-15 VITALS — BP 122/72 | HR 76 | Temp 98.8°F | Resp 17 | Ht 65.0 in | Wt 145.0 lb

## 2014-03-15 DIAGNOSIS — J209 Acute bronchitis, unspecified: Secondary | ICD-10-CM

## 2014-03-15 MED ORDER — AZITHROMYCIN 500 MG PO TABS: 500.0000 mg | ORAL_TABLET | Freq: Every day | ORAL | Status: DC

## 2014-03-15 MED ORDER — HYDROCODONE-ACETAMINOPHEN 7.5-325 MG/15ML PO SOLN: 5.0000 mL | Freq: Four times a day (QID) | ORAL | Status: DC | PRN

## 2014-03-15 NOTE — Patient Instructions (Signed)
Bronquitis aguda  ( Acute Bronchitis)  La bronquitis es una inflamación de las vías respiratorias que se extienden desde la tráquea hasta los pulmones (bronquios). La inflamación produce la formación de mucosidad. Esto produce tos, que es el síntoma más frecuente de la bronquitis.   Cuando la bronquitis es aguda, generalmente comienza de manera súbita y desaparece luego de un par de semanas. El hábito de fumar, las alergias y el asma pueden empeorar la bronquitis. Los episodios repetidos de bronquitis pueden causar más problemas pulmonares.   CAUSAS  La causa más frecuente de bronquitis aguda es el mismo virus que produce el resfrío. El virus se propaga de persona a persona (contagioso).   SIGNOS Y SÍNTOMAS   · Tos.    · Fiebre.    · Tos con mucosidad.    · Dolores en el cuerpo.    · Congestión en el pecho.    · Escalofríos.    · Falta de aire.    · Dolor de garganta.    DIAGNÓSTICO   La bronquitis aguda en general se diagnostica con un examen físico. En algunos casos se indican otros estudios, como radiografías, para descartar otras enfermedades.   TRATAMIENTO   La bronquitis aguda generalmente desaparece en un par de semanas. Con frecuencia no es necesario realizar un tratamiento. Los medicamentos se indican para aliviar la fiebre o la tos. Generalmente no es necesario el uso de antibióticos, pero pueden indicarse en ciertas ocasiones. En algunos casos, se recomienda el uso de un inhalador para mejorar la falta de aire y controlar la tos. Un vaporizador de aire frío podrá ayudarlo a disolver las secreciones bronquiales y facilitar su eliminación.   INSTRUCCIONES PARA EL CUIDADO EN EL HOGAR  · Descanse lo suficiente.    · Beba líquidos en abundancia para mantener la orina de color claro o amarillo pálido (excepto que padezca una enfermedad que requiera la restricción de líquidos). Tome mucho líquido para disolver las secreciones y evitar la deshidratación.    · Tome sólo medicamentos de venta libre o recetados,  según las indicaciones del médico.    · Evite fumar o aspirar el humo de otros fumadores. La exposición al humo del cigarrillo o a irritantes químicos hará que la bronquitis empeore. Si fuma, considere el uso de goma de mascar o la aplicación de parches en la piel que contengan nicotina para aliviar los síntomas de abstinencia. Si deja de fumar, sus pulmones se curarán más rápido.    · Reduzca la probabilidad de otro brote de bronquitis aguda lavando sus manos con frecuencia, evitando a las personas que tengan síntomas y tratando de no tocarse las manos con la boca, la nariz o los ojos.    · Concurra a las consultas de control con su médico según las indicaciones.    SOLICITE ATENCIÓN MÉDICA SI:  Los síntomas no mejoran después de 1 semana de tratamiento.   SOLICITE ATENCIÓN MÉDICA DE INMEDIATO SI:  · Comienza a tener fiebre o escalofríos cada vez más intensos.    · Siente dolor en el pecho.    · Le falta el aire de manera preocupante.  · La flema tiene sangre.    · Se deshidrata.  · Se desmaya.  · Tiene vómitos que se repiten.  · Tiene un dolor de cabeza intenso.  ASEGÚRESE DE QUE:   · Comprende estas instrucciones.  · Controlará su afección.  · Recibirá ayuda de inmediato si no mejora o si empeora.  Document Released: 11/26/2005 Document Revised: 07/29/2013  [employer]® Patient Information ©2014 [employer].

## 2014-03-15 NOTE — Progress Notes (Signed)
   Subjective:    Patient ID: [name], male    DOB: 03/15/1986, 28 y.o.   MRN: [id number]  HPI    Review of Systems     Objective:   Physical Exam  Pulmonary/Chest: Not tachypneic. He has no decreased breath sounds. He has no wheezes. He has rhonchi. He has no rales.          Assessment & Plan:  Acute bronchitis

## 2014-03-15 NOTE — Progress Notes (Signed)
   Subjective:    Patient ID: [name], male    DOB: 11/08/1986, 28 y.o.   MRN: [id number]  HPI 28 year old male complains of sore throat, fever, cough, congestion. He is coughing up yellow mucus. He has been sick for 3 days. He states his whole body feels sore. He has taken benadryl, Advil and Thera flu but non of these OTC remedies have been helping the symptoms. He also states he has a little shortness of breath and chest pains mostly when taking a deep breath. His temperature was 104.0 around 10 pm last night. The Thera flu helped bring his temperature down.  No sob now. His son had pneumonia recently.  Review of Systems     Objective:   Physical Exam  Constitutional: He is oriented to person, place, and time. He appears well-developed and well-nourished. No distress.  HENT:  Head: Normocephalic.  Right Ear: External ear normal.  Left Ear: External ear normal.  Nose: Mucosal edema, rhinorrhea and sinus tenderness present. Right sinus exhibits no frontal sinus tenderness. Left sinus exhibits no maxillary sinus tenderness and no frontal sinus tenderness.  Mouth/Throat: Oropharynx is clear and moist.  Neck: Normal range of motion. Neck supple.  Cardiovascular: Normal rate.   Pulmonary/Chest: Effort normal. Not tachypneic. He has no decreased breath sounds. He has no wheezes. He has rhonchi.  Lymphadenopathy:    He has no cervical adenopathy.  Neurological: He is alert and oriented to person, place, and time. He exhibits normal muscle tone. Coordination normal.  Skin: No rash noted.  Psychiatric: He has a normal mood and affect. His behavior is normal. Judgment and thought content normal.          Assessment & Plan:  Zithromax 500mg Leandro Reasoner/Lortab elixir

## 2014-12-20 ENCOUNTER — Ambulatory Visit (INDEPENDENT_AMBULATORY_CARE_PROVIDER_SITE_OTHER): Payer: BLUE CROSS/BLUE SHIELD | Admitting: Internal Medicine

## 2014-12-20 ENCOUNTER — Ambulatory Visit (INDEPENDENT_AMBULATORY_CARE_PROVIDER_SITE_OTHER): Payer: BLUE CROSS/BLUE SHIELD

## 2014-12-20 VITALS — BP 126/82 | HR 65 | Temp 98.2°F | Resp 16 | Ht 65.5 in | Wt 168.4 lb

## 2014-12-20 DIAGNOSIS — R079 Chest pain, unspecified: Secondary | ICD-10-CM

## 2014-12-20 DIAGNOSIS — R002 Palpitations: Secondary | ICD-10-CM

## 2014-12-20 LAB — CBC WITH DIFFERENTIAL/PLATELET
Basophils Absolute: 0.1 10*3/uL (ref 0.0–0.1)
Basophils Relative: 1 % (ref 0–1)
Eosinophils Absolute: 0.4 10*3/uL (ref 0.0–0.7)
Eosinophils Relative: 5 % (ref 0–5)
HCT: 44.6 % (ref 39.0–52.0)
Hemoglobin: 15.3 g/dL (ref 13.0–17.0)
Lymphocytes Relative: 37 % (ref 12–46)
Lymphs Abs: 3 10*3/uL (ref 0.7–4.0)
MCH: 28.2 pg (ref 26.0–34.0)
MCHC: 34.3 g/dL (ref 30.0–36.0)
MCV: 82.3 fL (ref 78.0–100.0)
MPV: 9.7 fL (ref 8.6–12.4)
Monocytes Absolute: 1 10*3/uL (ref 0.1–1.0)
Monocytes Relative: 13 % — ABNORMAL HIGH (ref 3–12)
Neutro Abs: 3.5 10*3/uL (ref 1.7–7.7)
Neutrophils Relative %: 44 % (ref 43–77)
Platelets: 304 10*3/uL (ref 150–400)
RBC: 5.42 MIL/uL (ref 4.22–5.81)
RDW: 14.1 % (ref 11.5–15.5)
WBC: 8 10*3/uL (ref 4.0–10.5)

## 2014-12-20 LAB — TSH: TSH: 2.305 u[IU]/mL (ref 0.350–4.500)

## 2014-12-20 LAB — GLUCOSE, POCT (MANUAL RESULT ENTRY): POC Glucose: 105 mg/dl — AB (ref 70–99)

## 2014-12-20 NOTE — Patient Instructions (Addendum)
Panic Attacks Panic attacks are sudden, short-livedsurges of severe anxiety, fear, or discomfort. They may occur for no reason when you are relaxed, when you are anxious, or when you are sleeping. Panic attacks may occur for a number of reasons:   Healthy people occasionally have panic attacks in extreme, life-threatening situations, such as war or natural disasters. Normal anxiety is a protective mechanism of the body that helps us react to danger (fight or flight response).  Panic attacks are often seen with anxiety disorders, such as panic disorder, social anxiety disorder, generalized anxiety disorder, and phobias. Anxiety disorders cause excessive or uncontrollable anxiety. They may interfere with your relationships or other life activities.  Panic attacks are sometimes seen with other mental illnesses, such as depression and posttraumatic stress disorder.  Certain medical conditions, prescription medicines, and drugs of abuse can cause panic attacks. SYMPTOMS  Panic attacks start suddenly, peak within 20 minutes, and are accompanied by four or more of the following symptoms:  Pounding heart or fast heart rate (palpitations).  Sweating.  Trembling or shaking.  Shortness of breath or feeling smothered.  Feeling choked.  Chest pain or discomfort.  Nausea or strange feeling in your stomach.  Dizziness, light-headedness, or feeling like you will faint.  Chills or hot flushes.  Numbness or tingling in your lips or hands and feet.  Feeling that things are not real or feeling that you are not yourself.  Fear of losing control or going crazy.  Fear of dying. Some of these symptoms can mimic serious medical conditions. For example, you may think you are having a heart attack. Although panic attacks can be very scary, they are not life threatening. DIAGNOSIS  Panic attacks are diagnosed through an assessment by your health care provider. Your health care provider will ask  questions about your symptoms, such as where and when they occurred. Your health care provider will also ask about your medical history and use of alcohol and drugs, including prescription medicines. Your health care provider may order blood tests or other studies to rule out a serious medical condition. Your health care provider may refer you to a mental health professional for further evaluation. TREATMENT   Most healthy people who have one or two panic attacks in an extreme, life-threatening situation will not require treatment.  The treatment for panic attacks associated with anxiety disorders or other mental illness typically involves counseling with a mental health professional, medicine, or a combination of both. Your health care provider will help determine what treatment is best for you.  Panic attacks due to physical illness usually go away with treatment of the illness. If prescription medicine is causing panic attacks, talk with your health care provider about stopping the medicine, decreasing the dose, or substituting another medicine.  Panic attacks due to alcohol or drug abuse go away with abstinence. Some adults need professional help in order to stop drinking or using drugs. HOME CARE INSTRUCTIONS   Take all medicines as directed by your health care provider.   Schedule and attend follow-up visits as directed by your health care provider. It is important to keep all your appointments. SEEK MEDICAL CARE IF:  You are not able to take your medicines as prescribed.  Your symptoms do not improve or get worse. SEEK IMMEDIATE MEDICAL CARE IF:   You experience panic attack symptoms that are different than your usual symptoms.  You have serious thoughts about hurting yourself or others.  You are taking medicine for panic attacks and   have a serious side effect. MAKE SURE YOU:  Understand these instructions.  Will watch your condition.  Will get help right away if you are not  doing well or get worse. Document Released: 11/26/2005 Document Revised: 12/01/2013 Document Reviewed: 07/10/2013 [facility] Patient Information 2015 [address], [place]. This information is not intended to replace advice given to you by your health care provider. Make sure you discuss any questions you have with your health care provider. Stress Stress-related medical problems are becoming increasingly common. The body has a built-in physical response to stressful situations. Faced with pressure, challenge or danger, we need to react quickly. Our bodies release hormones such as cortisol and adrenaline to help do this. These hormones are part of the "fight or flight" response and affect the metabolic rate, heart rate and blood pressure, resulting in a heightened, stressed state that prepares the body for optimum performance in dealing with a stressful situation. It is likely that early man required these mechanisms to stay alive, but usually modern stresses do not call for this, and the same hormones released in today's world can damage health and reduce coping ability. CAUSES  Pressure to perform at work, at school or in sports.  Threats of physical violence.  Money worries.  Arguments.  Family conflicts.  Divorce or separation from significant other.  Bereavement.  New job or unemployment.  Changes in location.  Alcohol or drug abuse. SOMETIMES, THERE IS NO PARTICULAR REASON FOR DEVELOPING STRESS. Almost all people are at risk of being stressed at some time in their lives. It is important to know that some stress is temporary and some is long term.  Temporary stress will go away when a situation is resolved. Most people can cope with short periods of stress, and it can often be relieved by relaxing, taking a walk or getting any type of exercise, chatting through issues with friends, or having a good night's sleep.  Chronic (long-term, continuous) stress is much harder to deal with. It  can be psychologically and emotionally damaging. It can be harmful both for an individual and for friends and family. SYMPTOMS Everyone reacts to stress differently. There are some common effects that help [place] recognize it. In times of extreme stress, people may:  Shake uncontrollably.  Breathe faster and deeper than normal (hyperventilate).  Vomit.  For people with asthma, stress can trigger an attack.  For some people, stress may trigger migraine headaches, ulcers, and body pain. PHYSICAL EFFECTS OF STRESS MAY INCLUDE:  Loss of energy.  Skin problems.  Aches and pains resulting from tense muscles, including neck ache, backache and tension headaches.  Increased pain from arthritis and other conditions.  Irregular heart beat (palpitations).  Periods of irritability or anger.  Apathy or depression.  Anxiety (feeling uptight or worrying).  Unusual behavior.  Loss of appetite.  Comfort eating.  Lack of concentration.  Loss of, or decreased, sex-drive.  Increased smoking, drinking, or recreational drug use.  For women, missed periods.  Ulcers, joint pain, and muscle pain. Post-traumatic stress is the stress caused by any serious accident, strong emotional damage, or extremely difficult or violent experience such as rape or war. Post-traumatic stress victims can experience mixtures of emotions such as fear, shame, depression, guilt or anger. It may include recurrent memories or images that may be haunting. These feelings can last for weeks, months or even years after the traumatic event that triggered them. Specialized treatment, possibly with medicines and psychological therapies, is available. If stress is causing physical  symptoms, severe distress or making it difficult for you to function as normal, it is worth seeing your caregiver. It is important to remember that although stress is a usual part of life, extreme or prolonged stress can lead to other illnesses that will  need treatment. It is better to visit a doctor sooner rather than later. Stress has been linked to the development of high blood pressure and heart disease, as well as insomnia and depression. There is no diagnostic test for stress since everyone reacts to it differently. But a caregiver will be able to spot the physical symptoms, such as:  Headaches.  Shingles.  Ulcers. Emotional distress such as intense worry, low mood or irritability should be detected when the doctor asks pertinent questions to identify any underlying problems that might be the cause. In case there are physical reasons for the symptoms, the doctor may also want to do some tests to exclude certain conditions. If you feel that you are suffering from stress, try to identify the aspects of your life that are causing it. Sometimes you may not be able to change or avoid them, but even a small change can have a positive ripple effect. A simple lifestyle change can make all the difference. STRATEGIES THAT CAN HELP DEAL WITH STRESS:  Delegating or sharing responsibilities.  Avoiding confrontations.  Learning to be more assertive.  Regular exercise.  Avoid using alcohol or street drugs to cope.  Eating a healthy, balanced diet, rich in fruit and vegetables and proteins.  Finding humor or absurdity in stressful situations.  Never taking on more than you know you can handle comfortably.  Organizing your time better to get as much done as possible.  Talking to friends or family and sharing your thoughts and fears.  Listening to music or relaxation tapes.  Relaxation techniques like deep breathing, meditation, and yoga.  Tensing and then relaxing your muscles, starting at the toes and working up to the head and neck. If you think that you would benefit from help, either in identifying the things that are causing your stress or in learning techniques to help you relax, see a caregiver who is capable of helping you with  this. Rather than relying on medications, it is usually better to try and identify the things in your life that are causing stress and try to deal with them. There are many techniques of managing stress including counseling, psychotherapy, aromatherapy, yoga, and exercise. Your caregiver can help you determine what is best for you. Document Released: 02/16/2003 Document Revised: 12/01/2013 Document Reviewed: 01/13/2008 [facility] Patient Information 2015 [place], [place]. This information is not intended to replace advice given to you by your health care provider. Make sure you discuss any questions you have with your health care provider. Palpitations A palpitation is the feeling that your heartbeat is irregular or is faster than normal. It may feel like your heart is fluttering or skipping a beat. Palpitations are usually not a serious problem. However, in some cases, you may need further medical evaluation. CAUSES  Palpitations can be caused by:  Smoking.  Caffeine or other stimulants, such as diet pills or energy drinks.  Alcohol.  Stress and anxiety.  Strenuous physical activity.  Fatigue.  Certain medicines.  Heart disease, especially if you have a history of irregular heart rhythms (arrhythmias), such as atrial fibrillation, atrial flutter, or supraventricular tachycardia.  An improperly working pacemaker or defibrillator. DIAGNOSIS  To find the cause of your palpitations, your health care provider will take  your medical history and perform a physical exam. Your health care provider may also have you take a test called an ambulatory electrocardiogram (ECG). An ECG records your heartbeat patterns over a 24-hour period. You may also have other tests, such as:  Transthoracic echocardiogram (TTE). During echocardiography, sound waves are used to evaluate how blood flows through your heart.  Transesophageal echocardiogram (TEE).  Cardiac monitoring. This allows your health care  provider to monitor your heart rate and rhythm in real time.  Holter monitor. This is a portable device that records your heartbeat and can help diagnose heart arrhythmias. It allows your health care provider to track your heart activity for several days, if needed.  Stress tests by exercise or by giving medicine that makes the heart beat faster. TREATMENT  Treatment of palpitations depends on the cause of your symptoms and can vary greatly. Most cases of palpitations do not require any treatment other than time, relaxation, and monitoring your symptoms. Other causes, such as atrial fibrillation, atrial flutter, or supraventricular tachycardia, usually require further treatment. HOME CARE INSTRUCTIONS   Avoid:  Caffeinated coffee, tea, soft drinks, diet pills, and energy drinks.  Chocolate.  Alcohol.  Stop smoking if you smoke.  Reduce your stress and anxiety. Things that can help you relax include:  A method of controlling things in your body, such as your heartbeats, with your mind (biofeedback).  Yoga.  Meditation.  Physical activity such as swimming, jogging, or walking.  Get plenty of rest and sleep. SEEK MEDICAL CARE IF:   You continue to have a fast or irregular heartbeat beyond 24 hours.  Your palpitations occur more often. SEEK IMMEDIATE MEDICAL CARE IF:  You have chest pain or shortness of breath.  You have a severe headache.  You feel dizzy or you faint. MAKE SURE YOU:  Understand these instructions.  Will watch your condition.  Will get help right away if you are not doing well or get worse. Document Released: 11/23/2000 Document Revised: 12/01/2013 Document Reviewed: 01/25/2012 [facility] Patient Information 2015 [place], [place]. This information is not intended to replace advice given to you by your health care provider. Make sure you discuss any questions you have with your health care provider.

## 2014-12-20 NOTE — Progress Notes (Signed)
   Subjective:    Patient ID: [name], male    DOB: 05/23/1986, 29 y.o.   MRN: [id number]  HPI CO sudden onset sob, palpitations, chest pain. No dizzy or syncope. Can have HA with this spell. 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 See medical record.   Review of Systems     Objective:   Physical Exam  Constitutional: He is oriented to person, place, and time. He appears well-developed and well-nourished. No distress.  HENT:  Head: Normocephalic.  Mouth/Throat: Oropharynx is clear and moist.  Eyes: Conjunctivae and EOM are normal. Pupils are equal, round, and reactive to light.  Neck: Normal range of motion. Neck supple. No thyromegaly present.  Cardiovascular: Normal rate, regular rhythm, normal heart sounds and intact distal pulses.   Pulmonary/Chest: Effort normal and breath sounds normal. No respiratory distress. He exhibits no tenderness.  Abdominal: Soft.  Musculoskeletal: Normal range of motion.  Lymphadenopathy:    He has no cervical adenopathy.  Neurological: He is alert and oriented to person, place, and time. No cranial nerve deficit. He exhibits normal muscle tone. Coordination normal.  Psychiatric: He has a normal mood and affect.   EKG normal [facility] reading (PRIMARY) by  Dr Perrin MalteseGuest normal cxr  VS are normal/oximetry 100%       Assessment & Plan:  Chest pain/Palpitations Possible panic disorder RO cardiovascular disease/refer to Dr. Jacinto HalimGanji

## 2015-02-07 ENCOUNTER — Emergency Department (HOSPITAL_COMMUNITY)
Admission: EM | Admit: 2015-02-07 | Discharge: 2015-02-07 | Disposition: A | Discharge status: Home / Self Care | Payer: BLUE CROSS/BLUE SHIELD | Attending: Emergency Medicine | Admitting: Emergency Medicine

## 2015-02-07 ENCOUNTER — Emergency Department (HOSPITAL_COMMUNITY): Payer: BLUE CROSS/BLUE SHIELD

## 2015-02-07 ENCOUNTER — Encounter (HOSPITAL_COMMUNITY): Payer: Self-pay | Admitting: Emergency Medicine

## 2015-02-07 DIAGNOSIS — S3992XA Unspecified injury of lower back, initial encounter: Secondary | ICD-10-CM | POA: Insufficient documentation

## 2015-02-07 DIAGNOSIS — E876 Hypokalemia: Secondary | ICD-10-CM | POA: Diagnosis not present

## 2015-02-07 DIAGNOSIS — Y9389 Activity, other specified: Secondary | ICD-10-CM | POA: Insufficient documentation

## 2015-02-07 DIAGNOSIS — Y929 Unspecified place or not applicable: Secondary | ICD-10-CM | POA: Diagnosis not present

## 2015-02-07 DIAGNOSIS — Z8619 Personal history of other infectious and parasitic diseases: Secondary | ICD-10-CM | POA: Diagnosis not present

## 2015-02-07 DIAGNOSIS — Z87891 Personal history of nicotine dependence: Secondary | ICD-10-CM | POA: Insufficient documentation

## 2015-02-07 DIAGNOSIS — W01198A Fall on same level from slipping, tripping and stumbling with subsequent striking against other object, initial encounter: Secondary | ICD-10-CM | POA: Diagnosis not present

## 2015-02-07 DIAGNOSIS — S24109A Unspecified injury at unspecified level of thoracic spinal cord, initial encounter: Secondary | ICD-10-CM | POA: Insufficient documentation

## 2015-02-07 DIAGNOSIS — M62830 Muscle spasm of back: Secondary | ICD-10-CM | POA: Insufficient documentation

## 2015-02-07 DIAGNOSIS — Y998 Other external cause status: Secondary | ICD-10-CM | POA: Diagnosis not present

## 2015-02-07 DIAGNOSIS — M549 Dorsalgia, unspecified: Secondary | ICD-10-CM

## 2015-02-07 DIAGNOSIS — W19XXXA Unspecified fall, initial encounter: Secondary | ICD-10-CM

## 2015-02-07 LAB — CBC WITH DIFFERENTIAL/PLATELET
Basophils Absolute: 0 10*3/uL (ref 0.0–0.1)
Basophils Relative: 0 % (ref 0–1)
Eosinophils Absolute: 0.2 10*3/uL (ref 0.0–0.7)
Eosinophils Relative: 2 % (ref 0–5)
HCT: 40.2 % (ref 39.0–52.0)
Hemoglobin: 14.1 g/dL (ref 13.0–17.0)
Lymphocytes Relative: 23 % (ref 12–46)
Lymphs Abs: 2.2 10*3/uL (ref 0.7–4.0)
MCH: 29.1 pg (ref 26.0–34.0)
MCHC: 35.1 g/dL (ref 30.0–36.0)
MCV: 82.9 fL (ref 78.0–100.0)
Monocytes Absolute: 0.9 10*3/uL (ref 0.1–1.0)
Monocytes Relative: 9 % (ref 3–12)
Neutro Abs: 6.3 10*3/uL (ref 1.7–7.7)
Neutrophils Relative %: 66 % (ref 43–77)
Platelets: 283 10*3/uL (ref 150–400)
RBC: 4.85 MIL/uL (ref 4.22–5.81)
RDW: 12.9 % (ref 11.5–15.5)
WBC: 9.6 10*3/uL (ref 4.0–10.5)

## 2015-02-07 LAB — I-STAT CHEM 8, ED
BUN: 8 mg/dL (ref 6–23)
Calcium, Ion: 1.15 mmol/L (ref 1.12–1.23)
Chloride: 100 mmol/L (ref 96–112)
Creatinine, Ser: 0.8 mg/dL (ref 0.50–1.35)
Glucose, Bld: 85 mg/dL (ref 70–99)
HCT: 44 % (ref 39.0–52.0)
Hemoglobin: 15 g/dL (ref 13.0–17.0)
Potassium: 3.1 mmol/L — ABNORMAL LOW (ref 3.5–5.1)
Sodium: 140 mmol/L (ref 135–145)
TCO2: 23 mmol/L (ref 0–100)

## 2015-02-07 MED ORDER — MORPHINE SULFATE 4 MG/ML IJ SOLN
4.0000 mg | Freq: Once | INTRAMUSCULAR | Status: AC
  Administered 2015-02-07: 4 mg via INTRAVENOUS
  Filled 2015-02-07: qty 1

## 2015-02-07 MED ORDER — NAPROXEN 500 MG PO TABS: 500.0000 mg | ORAL_TABLET | Freq: Two times a day (BID) | ORAL | Status: DC | PRN

## 2015-02-07 MED ORDER — CYCLOBENZAPRINE HCL 10 MG PO TABS: 10.0000 mg | ORAL_TABLET | Freq: Three times a day (TID) | ORAL | Status: DC | PRN

## 2015-02-07 MED ORDER — HYDROCODONE-ACETAMINOPHEN 5-325 MG PO TABS: 1.0000 | ORAL_TABLET | Freq: Four times a day (QID) | ORAL | Status: DC | PRN

## 2015-02-07 MED ORDER — POTASSIUM CHLORIDE CRYS ER 20 MEQ PO TBCR
60.0000 meq | EXTENDED_RELEASE_TABLET | Freq: Once | ORAL | Status: AC
  Administered 2015-02-07: 60 meq via ORAL
  Filled 2015-02-07: qty 3

## 2015-02-07 NOTE — ED Notes (Signed)
Ambulated pt in hallway. Ambulated approximately 20 ft. Pt had pain in right hip radating into groin. PA made aware.

## 2015-02-07 NOTE — ED Provider Notes (Signed)
Date: 02/07/2015  Rate: 72  Rhythm: normal sinus rhythm  QRS Axis: normal  Intervals: normal  ST/T Wave abnormalities: r r' in vs1 and vs2  Conduction Disutrbances: none  Narrative Interpretation: unremarkable  ekg link not available in epic for interpretation in muse     Ethelda ChickMartha K Linker, MD 02/07/15 [phone]

## 2015-02-07 NOTE — Discharge Instructions (Signed)
Back Pain:  Your back pain should be treated with medicines such as ibuprofen or aleve and this back pain should get better over the next 2 weeks.  However if you develop severe or worsening pain, low back pain with fever, numbness, weakness or inability to walk or urinate, you should return to the ER immediately.  Please follow up with your doctor this week for a recheck if still having symptoms. NO HEAVY LIFTING OVER 10 POUNDS, NO TWISTING OR BENDING.   Low back pain is discomfort in the lower back that may be due to injuries to muscles and ligaments around the spine.  Occasionally, it may be caused by a a problem to a part of the spine called a disc.  The pain may last several days or a week;  However, most patients get completely well in 4 weeks.  Self - care:  The application of heat can help soothe the pain.  Maintaining your daily activities, including walking, is encourged, as it will help you get better faster than just staying in bed. Perform gentle stretching as discussed. Drink plenty of fluids.  Medications are also useful to help with pain control.  A commonly prescribed medication includes norco.  Do not drive or operate heavy machinery while taking this medication.  Non steroidal anti inflammatory medications including Ibuprofen and naproxen;  These medications help both pain and swelling and are very useful in treating back pain.  They should be taken with food, as they can cause stomach upset, and more seriously, stomach bleeding.    Muscle relaxants:  These medications can help with muscle tightness that is a cause of lower back pain.  Most of these medications can cause drowsiness, and it is not safe to drive or use dangerous machinery while taking them.  SEEK IMMEDIATE MEDICAL ATTENTION IF: New numbness, tingling, weakness, or problem with the use of your arms or legs.  Severe back pain not relieved with medications.  Difficulty with or loss of control of your bowel or bladder  control.  Increasing pain in any areas of the body (such as chest or abdominal pain).  Shortness of breath, dizziness or fainting.  Nausea (feeling sick to your stomach), vomiting, fever, or sweats.  You will need to follow up with  Your primary healthcare provider in 1-2 weeks for reassessment.  If you do not have a doctor see the list below.   Emergency Department Resource Guide 1) Find a Doctor and Pay Out of Pocket Although you won't have to find out who is covered by your insurance plan, it is a good idea to ask around and get recommendations. You will then need to call the office and see if the doctor you have chosen will accept you as a new patient and what types of options they offer for patients who are self-pay. Some doctors offer discounts or will set up payment plans for their patients who do not have insurance, but you will need to ask so you aren't surprised when you get to your appointment.  2) Contact Your Local Health Department Not all health departments have doctors that can see patients for sick visits, but many do, so it is worth a call to see if yours does. If you don't know where your local health department is, you can check in your phone book. The CDC also has a tool to help you locate your state's health department, and many state websites also have listings of all of their local health departments.  3) Find a [facility] If your illness is not likely to be very severe or complicated, you may want to try a walk in clinic. These are popping up all over the country in pharmacies, drugstores, and shopping centers. They're usually staffed by nurse practitioners or physician assistants that have been trained to treat common illnesses and complaints. They're usually fairly quick and inexpensive. However, if you have serious medical issues or chronic medical problems, these are probably not your best option.  No Primary Care Doctor: - Call [facility] at  [phone] - they  can help you locate a primary care doctor that  accepts your insurance, provides certain services, etc. - Physician Referral Service- [phone]  Chronic Pain Problems: Organization         Address  Phone   Notes  [facility]  [phone] Patients need to be referred by their primary care doctor.   Medication Assistance: Organization         Address  Phone   Notes  [facility] Medication [facility] [address] [place], [place] [address] [phone] --Must be a resident of [facility] -- Must have NO insurance coverage whatsoever (no Medicaid/ Medicare, etc.) -- The pt. MUST have a primary care doctor that directs their care regularly and follows them in the community   [facility]  [phone]   [employer]  [phone]    Agencies that provide inexpensive medical care: Organization         Address  Phone   Notes  [place]  [phone]   [facility]    [phone]   [facility] [place], [place] [address] [phone]   [place] [address] [place] [phone]   [facility]    [phone]   [facility]    [phone]   [place] and [address], [place] Phone:  [phone], Fax:  [phone] Hours of Operation:  9 am - 6 pm, M-F.  Also accepts Medicaid/Medicare and self-pay.  [facility] for [place], [address], [place] Phone: [phone], Fax: [phone]. Hours of Operation:  8:30 am - 5:30 pm, M-F.  Also accepts Medicaid and self-pay.  [facility] [address] [place] Phone: [phone]   [place], [place], [place] [phone], Ext. [phone] Mondays & Thursdays: 7-9 AM.  First 15 patients are seen on a first come, first serve basis.    [place] Providers:  Organization         Address  Phone   Notes  [facility] [address],  [phone] Also accepts self-pay patients.  [facility] [address], [place]  [phone]   [place], [address], [place] [phone]   [facility] [address] [place], [place] [phone]   Lucianne Lei [address], [place]   [phone] Only accepts [place] patients after they have their name applied to their card.   Self-Pay (no insurance) in [facility]:  Organization         Address  Phone   Notes  Sickle Cell Patients, [facility] [phone]  [place] [phone]   [facility] [place] [phone]   [facility] [place]  [place], [place], [place] [phone]   [facility]/Dr. Osei-Bonsu  [address], [place] or [address], [place] [phone] Phone number for both [place] and [place] locations is the same.  [facility] [address] [place] [phone]   [facility] [address], [place] or [address] [phone]   [facility] [address], [place] [phone], phone; [phone], fax Sees patients 1st and 3rd Saturday of every month.  Must not qualify for public or private insurance (i.e. Medicaid, Medicare, [place] Health Choice, Veterans' Benefits)  Household income should be no more than 200% of the poverty level The clinic cannot treat you if you are pregnant or think you are pregnant  Sexually transmitted diseases are not treated at the clinic.    Back Pain, Adult Back pain is very common. The pain often gets better over time. The cause of back  pain is usually not dangerous. Most people can learn to manage their back pain on their own.  HOME CARE   Stay active. Start with short walks on flat ground if you can. Try to walk farther each day.  Do not sit, drive, or stand in one place for more than 30 minutes. Do not stay in bed.  Do not avoid exercise or work. Activity can help your back heal faster.  Be careful when you bend or lift an object. Bend at your knees, keep the object close to you, and do not twist.  Sleep on a firm mattress. Lie on your side, and bend your knees. If you lie on your back, put a pillow under your knees.  Only take medicines as told by your doctor.  Put ice on the injured area.  Put ice in a plastic bag.  Place a towel between your skin and the bag.  Leave the ice on for 15-20 minutes, 03-04 times a day for the first 2 to 3 days. After that, you can switch between ice and heat packs.  Ask your doctor about back exercises or massage.  Avoid feeling anxious or stressed. Find good ways to deal with stress, such as exercise. GET HELP RIGHT AWAY IF:   Your pain does not go away with rest or medicine.  Your pain does not go away in 1 week.  You have new problems.  You do not feel well.  The pain spreads into your legs.  You cannot control when you poop (bowel movement) or pee (urinate).  Your arms or legs feel weak or lose feeling (numbness).  You feel sick to your stomach (nauseous) or throw up (vomit).  You have belly (abdominal) pain.  You feel like you may pass out (faint). MAKE SURE YOU:   Understand these instructions.  Will watch your condition.  Will get help right away if you are not doing well or get worse. Document Released: 05/14/2008 Document Revised: 02/18/2012 Document Reviewed: 03/30/2014 [facility] Patient Information 2015 [place], [place]. This information is not intended to replace advice given to you by your health care provider. Make sure you discuss any questions  you have with your health care provider.  Back Injury Prevention The following tips can help you to prevent a back injury. PHYSICAL  FITNESS  Exercise often. Try to develop strong stomach (abdominal) muscles.  Do aerobic exercises often. This includes walking, jogging, biking, swimming.  Do exercises that help with balance and strength often. This includes tai chi and yoga.  Stretch before and after you exercise.  Keep a healthy weight. DIET   Ask your doctor how much calcium and vitamin D you need every day.  Include calcium in your diet. Foods high in calcium include dairy products; green, leafy vegetables; and products with calcium added (fortified).  Include vitamin D in your diet. Foods high in vitamin D include milk and products with vitamin D added.  Think about taking a multivitamin or other nutritional products called " supplements."  Stop smoking if you smoke. POSTURE   Sit and stand up straight. Avoid leaning forward or hunching over.  Choose chairs that support your lower back.  If you work at a desk:  Sit close to your work so you do not lean over.  Keep your chin tucked in.  Keep your neck drawn back.  Keep your elbows bent at a right angle. Your arms should look like the letter "L."  Sit high and close to the steering wheel when you drive. Add low back support to your car seat if needed.  Avoid sitting or standing in one position for too long. Get up and move around every hour. Take breaks if you are driving for a long time.  Sleep on your side with your knees slightly bent. You can also sleep on your back with a pillow under your knees. Do not sleep on your stomach. LIFTING, TWISTING, AND REACHING  Avoid heavy lifting, especially lifting over and over again. If you must do heavy lifting:  Stretch before lifting.  Work slowly.  Rest between lifts.  Use carts and dollies to move objects when possible.  Make several small trips instead of  carrying 1 heavy load.  Ask for help when you need it.  Ask for help when moving big, awkward objects.  Follow these steps when lifting:  Stand with your feet shoulder-width apart.  Get as close to the object as you can. Do not pick up heavy objects that are far from your body.  Use handles or lifting straps when possible.  Bend at your knees. Squat down, but keep your heels off the floor.  Keep your shoulders back, your chin tucked in, and your back straight.  Lift the object slowly. Tighten the muscles in your legs, stomach, and butt. Keep the object as close to the center of your body as possible.  Reverse these directions when you put a load down.  Do not:  Lift the object above your waist.  Twist at the waist while lifting or carrying a load. Move your feet if you need to turn, not your waist.  Bend over without bending at your knees.  Avoid reaching over your head, across a table, or for an object on a high surface. OTHER TIPS  Avoid wet floors and keep sidewalks clear of ice.  Do not sleep on a mattress that is too soft or too hard.  Keep items that you use often within easy reach.  Put heavier objects on shelves at waist level. Put lighter objects on lower or higher shelves.  Find ways to lessen your stress. You can try exercise, massage, or relaxation.  Get help for depression or anxiety if needed. GET HELP IF:  You injure your back.  You have questions about diet,  exercise, or other ways to prevent back injuries. MAKE SURE YOU:  Understand these instructions.  Will watch your condition.  Will get help right away if you are not doing well or get worse. Document Released: 05/14/2008 Document Revised: 02/18/2012 Document Reviewed: 01/07/2012 [facility] Patient Information 2015 [place], [place]. This information is not intended to replace advice given to you by your health care provider. Make sure you discuss any questions you have with your health care  provider.  Back Exercises Back exercises help treat and prevent back injuries. The goal is to increase your strength in your belly (abdominal) and back muscles. These exercises can also help with flexibility. Start these exercises when told by your doctor. HOME CARE Back exercises include: Pelvic Tilt.  Lie on your back with your knees bent. Tilt your pelvis until the lower part of your back is against the floor. Hold this position 5 to 10 sec. Repeat this exercise 5 to 10 times. Knee to Chest.  Pull 1 knee up against your chest and hold for 20 to 30 seconds. Repeat this with the other knee. This may be done with the other leg straight or bent, whichever feels better. Then, pull both knees up against your chest. Sit-Ups or Curl-Ups.  Bend your knees 90 degrees. Start with tilting your pelvis, and do a partial, slow sit-up. Only lift your upper half 30 to 45 degrees off the floor. Take at least 2 to 3 seonds for each sit-up. Do not do sit-ups with your knees out straight. If partial sit-ups are difficult, simply do the above but with only tightening your belly (abdominal) muscles and holding it as told. Hip-Lift.  Lie on your back with your knees flexed 90 degrees. Push down with your feet and shoulders as you raise your hips 2 inches off the floor. Hold for 10 seconds, repeat 5 to 10 times. Back Arches.  Lie on your stomach. Prop yourself up on bent elbows. Slowly press on your hands, causing an arch in your low back. Repeat 3 to 5 times. Shoulder-Lifts.  Lie face down with arms beside your body. Keep hips and belly pressed to floor as you slowly lift your head and shoulders off the floor. Do not overdo your exercises. Be careful in the beginning. Exercises may cause you some mild back discomfort. If the pain lasts for more than 15 minutes, stop the exercises until you see your doctor. Improvement with exercise for back problems is slow.  Document Released: 12/29/2010 Document Revised:  02/18/2012 Document Reviewed: 09/27/2011 [facility] Patient Information 2015 [place], [place]. This information is not intended to replace advice given to you by your health care provider. Make sure you discuss any questions you have with your health care provider.  Heat Therapy Heat therapy can help ease sore, stiff, injured, and tight muscles and joints. Heat relaxes your muscles, which may help ease your pain.  RISKS AND COMPLICATIONS If you have any of the following conditions, do not use heat therapy unless your health care provider has approved:  Poor circulation.  Healing wounds or scarred skin in the area being treated.  Diabetes, heart disease, or high blood pressure.  Not being able to feel (numbness) the area being treated.  Unusual swelling of the area being treated.  Active infections.  Blood clots.  Cancer.  Inability to communicate pain. This may include young children and people who have problems with their brain function (dementia).  Pregnancy. Heat therapy should only be used on old, pre-existing, or long-lasting (  chronic) injuries. Do not use heat therapy on new injuries unless directed by your health care provider. HOW TO USE HEAT THERAPY There are several different kinds of heat therapy, including:  Moist heat pack.  Warm water bath.  Hot water bottle.  Electric heating pad.  Heated gel pack.  Heated wrap.  Electric heating pad. Use the heat therapy method suggested by your health care provider. Follow your health care provider's instructions on when and how to use heat therapy. GENERAL HEAT THERAPY RECOMMENDATIONS  Do not sleep while using heat therapy. Only use heat therapy while you are awake.  Your skin may turn pink while using heat therapy. Do not use heat therapy if your skin turns red.  Do not use heat therapy if you have new pain.  High heat or long exposure to heat can cause burns. Be careful when using heat therapy to avoid burning  your skin.  Do not use heat therapy on areas of your skin that are already irritated, such as with a rash or sunburn. SEEK MEDICAL CARE IF:  You have blisters, redness, swelling, or numbness.  You have new pain.  Your pain is worse. MAKE SURE YOU:  Understand these instructions.  Will watch your condition.  Will get help right away if you are not doing well or get worse. Document Released: 02/18/2012 Document Revised: 04/12/2014 Document Reviewed: 01/19/2014 [facility] Patient Information 2015 [place], [place]. This information is not intended to replace advice given to you by your health care provider. Make sure you discuss any questions you have with your health care provider.

## 2015-02-07 NOTE — ED Notes (Signed)
Pt was standing on 793ft chair trying to untangle a balloon from celing fan. Pt states he lost his balance and fell back and hit his back on the chair. Pt c/o of mid and lower back pain.pt states he heard a pop upon falling. Pt denies any neck pain at this time. Pt states he tried to get up afterwards and was unable to stand because the pain. 50mcg of fentlyn given by ems.

## 2015-02-07 NOTE — ED Provider Notes (Signed)
CSN: [id number]     Arrival date & time 02/07/15  1614 History   First MD Initiated Contact with Patient 02/07/15 1622     Chief Complaint  Patient presents with  . Fall     (Consider location/radiation/quality/duration/timing/severity/associated sxs/prior Treatment) HPI Comments: [name] is a 29 y.o. male with a PMHx of cysticercosis, who presents to the ED with complaints of fall from a three-foot chair landing on his thoracic and lumbar back around 3 PM. He reports that he was standing and attempting to get something down from a ceiling fan, and lost his balance falling backwards, reporting that this is a mechanical fall due to a misstep. He endorses 6/10 thoracic and lumbar spine pain which she describes as throbbing and stinging, intermittent, nonradiating, worse with movement, and relieved somewhat with 50 g of fentanyl given by EMS. He endorses some tingling on his right anterior thigh. Denies any vision changes, dizziness, lightheadedness, syncope, LOC, head injury, neck pain or stiffness, bruising, wounds, abdominal pain, nausea, vomiting, chest pain, or shortness of breath. Denies any numbness or weakness. No cauda equina symptoms. Reports that he previously had some presyncopal episodes for which she was seen by [facility] cardiovascular physicians and given a Holter monitor 1 month, he has a follow-up appointment on March 2 to discuss these results. He is unsure of results at this time. He denies feeling presyncopal prior to falling.  Patient is a 29 y.o. male presenting with fall. The history is provided by the patient. No language interpreter was used.  Fall This is a new problem. The current episode started today. The problem occurs rarely. The problem has been unchanged. Pertinent negatives include no abdominal pain, arthralgias, chest pain, chills, fever, headaches, joint swelling, myalgias, nausea, neck pain, numbness, vertigo, visual change, vomiting or weakness. The  symptoms are aggravated by walking (movement). Treatments tried: fentanyl IV. The treatment provided moderate relief.    Past Medical History  Diagnosis Date  . 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 Family History  Problem Relation Age of Onset  . Hypertension Mother    History  Substance Use Topics  . Smoking status: Former Smoker    Types: Cigarettes    Quit date: 07/24/2012  . Smokeless tobacco: Never Used  . Alcohol Use: No    Review of Systems  Constitutional: Negative for fever and chills.  HENT: Negative for facial swelling.   Eyes: Negative for visual disturbance.  Respiratory: Negative for shortness of breath.   Cardiovascular: Negative for chest pain.  Gastrointestinal: Negative for nausea, vomiting and abdominal pain.  Genitourinary: Negative for dysuria, hematuria and difficulty urinating.       No incontinence  Musculoskeletal: Positive for back pain. Negative for myalgias, joint swelling, arthralgias, neck pain and neck stiffness.  Skin: Negative for color change and wound.  Neurological: Negative for dizziness, vertigo, syncope, weakness, light-headedness, numbness and headaches.       +anterior thigh tingling on R side  Psychiatric/Behavioral: Negative for confusion.   10 Systems reviewed and are negative for acute change except as noted in the HPI.    Allergies  Review of patient's allergies indicates no known allergies.  Home Medications   Prior to Admission medications   Not on File   BP 118/64 mmHg  Pulse 80  Temp(Src) 98.6 F (37 C) (Oral)  Resp 13  SpO2 100% Physical Exam  Constitutional:  He is oriented to person, place, and time. Vital signs are normal. He appears well-developed and well-nourished.  Non-toxic appearance. No distress. Cervical collar and backboard in place.  Afebrile, nontoxic, NAD, on backboard and C-collar  HENT:  Head:  Normocephalic and atraumatic. Head is without raccoon's eyes, without Battle's sign, without abrasion and without contusion.  Nose: Nose normal.  Mouth/Throat: Uvula is midline, oropharynx is clear and moist and mucous membranes are normal.  [place]/AT, no raccoon eyes or battle's sign, no contusions or abrasions  Eyes: Conjunctivae and EOM are normal. Pupils are equal, round, and reactive to light. Right eye exhibits no discharge. Left eye exhibits no discharge.  PERRL, EOMI, no nystagmus  Neck: Normal range of motion. Neck supple. No spinous process tenderness and no muscular tenderness present. No rigidity. Normal range of motion present.  FROM intact without spinous process or paraspinous muscle TTP, no bony stepoffs or deformities, no muscle spasms. No rigidity or meningeal signs. No bruising or swelling.   Cardiovascular: Normal rate, regular rhythm, normal heart sounds and intact distal pulses.  Exam reveals no gallop and no friction rub.   No murmur heard. RRR, nl s1/s2, no m/r/g, distal pulses intact, no pedal edema   Pulmonary/Chest: Effort normal and breath sounds normal. No respiratory distress. He has no decreased breath sounds. He has no wheezes. He has no rhonchi. He has no rales.  Abdominal: Soft. Normal appearance and bowel sounds are normal. He exhibits no distension. There is no tenderness. There is no rigidity, no rebound, no guarding, no tenderness at McBurney's point and negative Murphy's sign.  Musculoskeletal:       Thoracic back: He exhibits decreased range of motion (due to pain), tenderness, bony tenderness and spasm. He exhibits no deformity.       Lumbar back: He exhibits decreased range of motion (due to pain), tenderness, bony tenderness and spasm.       Back:  Thoracic and Lumbar spine with limited ROM d/t pain, mild midline spinous process TTP at approx T9-11 and L1-4 regions, no bony stepoffs or deformities, mild b/l paraspinous muscle TTP with slight palpable muscle  spasms. Strength 5/5 in upper extremities, but 4/5 in lower extremities due to pain, sensation grossly intact in all extremities, negative SLR bilaterally, gait not assessed during initial eval. No overlying skin changes.  All extremities nonTTP, no deformities. Pelvis stable.  Neurological: He is alert and oriented to person, place, and time. He has normal strength. No cranial nerve deficit or sensory deficit. GCS eye subscore is 4. GCS verbal subscore is 5. GCS motor subscore is 6.  CN 2-12 grossly intact A&O x4 GCS 15 Sensation and strength intact Coordination with finger-to-nose WNL  Skin: Skin is warm, dry and intact. No rash noted.  No bruising or abrasions  Psychiatric: He has a normal mood and affect.  Nursing note and vitals reviewed.   ED Course  Procedures (including critical care time) Labs Review Labs Reviewed  I-STAT CHEM 8, ED - Abnormal; Notable for the following:    Potassium 3.1 (*)    All other components within normal limits  CBC WITH DIFFERENTIAL/PLATELET    Imaging Review Dg Thoracic Spine W/swimmers  02/07/2015   CLINICAL DATA:  Fall, severe lower back pain  EXAM: THORACIC SPINE - 2 VIEW + SWIMMERS  COMPARISON:  None.  FINDINGS: Normal thoracic kyphosis.  No evidence of fracture or dislocation. Vertebral body heights and intervertebral disc spaces are maintained.  Visualized lungs are clear.  IMPRESSION: Normal  thoracic spine radiographs.   Electronically Signed   By: Charline BillsSriyesh  Krishnan M.D.   On: 02/07/2015 17:54   Dg Lumbar Spine Complete  02/07/2015   CLINICAL DATA:  Fall, severe low back pain  EXAM: LUMBAR SPINE - COMPLETE 4+ VIEW  COMPARISON:  None.  FINDINGS: Five lumbar type vertebral bodies.  Normal lumbar lordosis.  No evidence of fracture or dislocation. Vertebral heights and intervertebral disc spaces are maintained.  Visualized bony pelvis appears intact.  IMPRESSION: Normal lumbar spine radiographs.   Electronically Signed   By: Charline BillsSriyesh  Krishnan M.D.    On: 02/07/2015 17:54     EKG Interpretation None      Date: 02/07/2015 Rate: 72 Rhythm: normal sinus rhythm QRS Axis: normal Intervals: normal ST/T Wave abnormalities: r r' in vs1 and vs2 Conduction Disutrbances: none Narrative Interpretation: unremarkable  ekg link not available in epic for interpretation in muse     Ethelda ChickMartha K Linker, MD 02/07/15 1824           MDM   Final diagnoses:  Fall  Back pain  Muscle spasm of back  Hypokalemia    29 y.o. male with fall off 213ft chair and hit back. Has had presyncope in the past, worked up by [facility] physicians, had holter monitor [id number] and has f/up appt in 2 days for this. Pt states this was a mechanical fall, but given his history, will obtain basic labs and EKG. Tender over thoracic and lumbar spinous processes, will obtain xray imaging. Neurovascularly intact and nonfocal neuro exam, no head inj or LOC, doubt need for head/neck imaging. Given fentanyl with good pain control, will reassess shortly.   6:45 PM Chem 8 showing slight hypokalemia, repleted here. CBC w/diff unremarkable. Lumbar and thoracic imaging unremarkable. EKG unremarkable. Ambulatory in the hall with steady slightly antalgic gait, states his R paraspinous muscles hurt when ambulating, which is where he has some spasm. Again, no pelvic tenderness. Neurovascularly intact. Will have him use heat, NSAIDs, and give flexeril and norco for pain. Will give f/up resource guide to f/up in 2wks. I explained the diagnosis and have given explicit precautions to return to the ER including for any other new or worsening symptoms. The patient understands and accepts the medical plan as it's been dictated and I have answered their questions. Discharge instructions concerning home care and prescriptions have been given. The patient is STABLE and is discharged to home in good condition.  BP 128/72 mmHg  Pulse 64  Temp(Src) 98.6 F (37 C) (Oral)  Resp  16  SpO2 100%  Meds ordered this encounter  Medications  . potassium chloride SA (K-DUR,KLOR-CON) CR tablet 60 mEq    Sig:   . morphine 4 MG/ML injection 4 mg    Sig:   . naproxen (NAPROSYN) 500 MG tablet    Sig: Take 1 tablet (500 mg total) by mouth 2 (two) times daily as needed for mild pain, moderate pain or headache (TAKE WITH MEALS.).    Dispense:  20 tablet    Refill:  0    Order Specific Question:  Supervising Provider    Answer:  Eber HongMILLER, BRIAN D [[id number]]  . HYDROcodone-acetaminophen (NORCO) 5-325 MG per tablet    Sig: Take 1 tablet by mouth every 6 (six) hours as needed for severe pain.    Dispense:  10 tablet    Refill:  0    Order Specific Question:  Supervising Provider    Answer:  Eber HongMILLER, BRIAN D [[id number]]  .  cyclobenzaprine (FLEXERIL) 10 MG tablet    Sig: Take 1 tablet (10 mg total) by mouth 3 (three) times daily as needed for muscle spasms.    Dispense:  15 tablet    Refill:  0    Order Specific Question:  Supervising Provider    Answer:  Vida Roller [address] [place], PA-C 02/07/15 1850  Ethelda Chick, MD 02/07/15 [phone]

## 2015-02-09 ENCOUNTER — Telehealth: Payer: Self-pay

## 2015-02-09 NOTE — Telephone Encounter (Signed)
I tried to call pt and he will not discuss with me what he wants. I advised him that I would ask Dr. Milus GlazierLauenstein but could not guarantee a return call.

## 2015-02-09 NOTE — Telephone Encounter (Signed)
Pt wants Dr. Milus GlazierLauenstein to give him a call. I tried to get more information from him, but he just said he needed to talk to him. Please advise at [phone]

## 2015-02-10 NOTE — Telephone Encounter (Signed)
Patient fell on 02/07/2015 with back and shoulder pain.  Evaluated in ED.  Still having soreness, though nothing broken.  Cannot return to work until fully able, no light duty available. Recommend he come in on Sunday to see what else we can do. Note:  Potassium was very low on his visit to ED.

## 2015-02-13 ENCOUNTER — Ambulatory Visit (INDEPENDENT_AMBULATORY_CARE_PROVIDER_SITE_OTHER): Payer: BLUE CROSS/BLUE SHIELD | Admitting: Family Medicine

## 2015-02-13 VITALS — BP 126/78 | HR 84 | Temp 97.9°F | Ht 65.0 in | Wt 172.5 lb

## 2015-02-13 DIAGNOSIS — S20221A Contusion of right back wall of thorax, initial encounter: Secondary | ICD-10-CM

## 2015-02-13 DIAGNOSIS — M549 Dorsalgia, unspecified: Secondary | ICD-10-CM | POA: Diagnosis not present

## 2015-02-13 LAB — POCT URINALYSIS DIPSTICK
Bilirubin, UA: NEGATIVE
Glucose, UA: NEGATIVE
Ketones, UA: NEGATIVE
Leukocytes, UA: NEGATIVE
Nitrite, UA: NEGATIVE
Protein, UA: NEGATIVE
Spec Grav, UA: 1.015
Urobilinogen, UA: 0.2
pH, UA: 6.5

## 2015-02-13 LAB — POCT UA - MICROSCOPIC ONLY
Bacteria, U Microscopic: NEGATIVE
Casts, Ur, LPF, POC: NEGATIVE
Crystals, Ur, HPF, POC: NEGATIVE
Mucus, UA: NEGATIVE
RBC, urine, microscopic: NEGATIVE
WBC, Ur, HPF, POC: NEGATIVE
Yeast, UA: NEGATIVE

## 2015-02-13 MED ORDER — TRAMADOL HCL 50 MG PO TABS: 50.0000 mg | ORAL_TABLET | Freq: Three times a day (TID) | ORAL | Status: DC | PRN

## 2015-02-13 NOTE — Progress Notes (Signed)
Subjective:    Patient ID: [name], male    DOB: 08/02/1986, 29 y.o.   MRN: [id number] This chart was scribed for Elvina SidleKurt Lauenstein, MD by Littie Deedsichard Sun, Medical Scribe. This patient was seen in Room 5 and the patient's care was started at 10:53 AM.   HPI HPI Comments: [name] is a 29 y.o. male who presents to the [facility] for a follow-up for back pain. Patient reports having severe lower back pain radiating to his right testicle secondary to a fall that occurred 6 days ago. He was in his kitchen and fell from a high bar stool. Patient was seen in the ED following the fall. He reports having some bruising to the surrounding area. Patient notes that he had some dysuria the first time he urinated after the fall.   Patient continues to have right flank pain, rated at 8/10.  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 His company will not let him work until he has full duty.  Review of Systems  Genitourinary: Positive for dysuria and testicular pain.  Musculoskeletal: Positive for back pain.  Skin: Positive for wound.       Objective:   Physical Exam CONSTITUTIONAL: Well developed/well nourished HEAD: Normocephalic/atraumatic EYES: EOM/PERRL ENMT: Mucous membranes moist NECK: supple no meningeal signs SPINE: entire spine nontender CV: S1/S2 noted, no murmurs/rubs/gallops noted LUNGS: Lungs are clear to auscultation bilaterally, no apparent distress ABDOMEN: soft, nontender, no rebound or guarding GU: no cva tenderness NEURO: Pt is awake/alert, moves all extremitiesx4 EXTREMITIES: pulses normal, full ROM SKIN: Large bruise over lower ribs on the right. PSYCH: no abnormalities of mood noted  Patient has moderate swelling overlying the bruise over the right lower ribs and he does have tenderness diffusely in that area.  Results for orders placed or performed in visit on 02/13/15  POCT  urinalysis dipstick  Result Value Ref Range   Color, UA yellow    Clarity, UA clear    Glucose, UA neg    Bilirubin, UA neg    Ketones, UA neg    Spec Grav, UA 1.015    Blood, UA trace-intact    pH, UA 6.5    Protein, UA neg    Urobilinogen, UA 0.2    Nitrite, UA neg    Leukocytes, UA Negative   POCT UA - Microscopic Only  Result Value Ref Range   WBC, Ur, HPF, POC neg    RBC, urine, microscopic neg    Bacteria, U Microscopic neg    Mucus, UA neg    Epithelial cells, urine per micros 0-2    Crystals, Ur, HPF, POC neg    Casts, Ur, LPF, POC neg    Yeast, UA neg    Patient moves very slowly when asked to it in different positions for his exam    Assessment & Plan:   This chart was scribed in my presence and reviewed by me personally.    ICD-9-CM ICD-10-CM   1. Back pain, unspecified location 724.5 M54.9 POCT urinalysis dipstick     POCT UA - Microscopic Only     traMADol (ULTRAM) 50 MG tablet  2. Contusion, back, right, initial encounter 922.31 S20.221A traMADol (ULTRAM) 50 MG tablet   Patient clearly suffered a significant contusion to his right flank and he is unable to work at this time it is normal occupation. I'm thinking that in the next 1-2  weeks he will be able to get back.  Signed, Elvina Sidle, MD

## 2015-02-16 ENCOUNTER — Telehealth: Payer: Self-pay

## 2015-02-16 NOTE — Telephone Encounter (Signed)
Received patient FMLA paperwork via fax to be filled out by Dr. Elbert EwingsL in 5-7 business days. Please return to Disability Department upon Completion. I have scanned blank copies into Epic, and once completed jasmine or myself will scan forms and fax to [phone] after receiving $15 payment.

## 2015-02-18 NOTE — Telephone Encounter (Signed)
Patients paperwork is complete, and upfront for pick up. $15 fee collected upon pick up. LMOM

## 2015-02-18 NOTE — Telephone Encounter (Signed)
Patient would like to speak with Dr. Milus GlazierLauenstein in regards to back pain. [phone]

## 2015-02-20 ENCOUNTER — Encounter: Payer: Self-pay | Admitting: Family Medicine

## 2015-02-20 ENCOUNTER — Ambulatory Visit (INDEPENDENT_AMBULATORY_CARE_PROVIDER_SITE_OTHER): Payer: BLUE CROSS/BLUE SHIELD | Admitting: Family Medicine

## 2015-02-20 VITALS — BP 118/80 | HR 76 | Temp 97.9°F | Resp 14 | Ht 65.0 in | Wt 167.0 lb

## 2015-02-20 DIAGNOSIS — M5441 Lumbago with sciatica, right side: Secondary | ICD-10-CM | POA: Diagnosis not present

## 2015-02-20 DIAGNOSIS — M5442 Lumbago with sciatica, left side: Secondary | ICD-10-CM

## 2015-02-20 MED ORDER — MELOXICAM 7.5 MG PO TABS: 7.5000 mg | ORAL_TABLET | Freq: Every day | ORAL | Status: DC

## 2015-02-20 MED ORDER — HYDROCODONE-ACETAMINOPHEN 5-325 MG PO TABS: 1.0000 | ORAL_TABLET | Freq: Four times a day (QID) | ORAL | Status: DC | PRN

## 2015-02-20 NOTE — Patient Instructions (Signed)
Contusión °(Contusion) °Una contusión es un hematoma profundo. Las contusiones son el resultado de una lesión que causa sangrado debajo de la piel. La zona de la contusión puede ponerse azul, morada o amarilla. Las lesiones menores causarán contusiones sin dolor, pero las más graves pueden presentar dolor e inflamación durante un par de semanas.  °CAUSAS  °Generalmente, una contusión se debe a un golpe, un traumatismo o una fuerza directa en una zona del cuerpo. °SÍNTOMAS  °· Hinchazón y enrojecimiento en la zona de la lesión. °· Hematomas en la zona de la lesión. °· Dolor con la palpación y sensibilidad en la zona de la lesión. °· Dolor. °DIAGNÓSTICO  °Se puede establecer el diagnóstico al hacer una historia clínica y un examen físico. Tal vez sea necesario hacer una radiografía, una tomografía computarizada o una resonancia magnética para determinar si hay lesiones asociadas, como fracturas. °TRATAMIENTO  °El tratamiento específico dependerá de la zona del cuerpo donde se produjo la lesión. En general, el mejor tratamiento para una contusión es el reposo, la aplicación de hielo, la elevación de la zona y la aplicación de compresas frías en la zona de la lesión. Para calmar el dolor también podrán recomendarle medicamentos de venta libre. Pregúntele al médico cuál es el mejor tratamiento para su contusión. °INSTRUCCIONES PARA EL CUIDADO EN EL HOGAR  °· Aplique hielo sobre la zona lesionada. °¨ Ponga el hielo en una bolsa plástica. °¨ Colóquese una toalla entre la piel y la bolsa de hielo. °¨ Deje el hielo durante 15 a 20 minutos, 3 a 4 veces por día, o según las indicaciones del médico. °· Utilice los medicamentos de venta libre o recetados para calmar el dolor, el malestar o la fiebre, según se lo indique el médico. El médico podrá indicarle que evite tomar antiinflamatorios (aspirina, ibuprofeno y naproxeno) durante 48 horas ya que estos medicamentos pueden aumentar los hematomas. °· Mantenga la zona de la lesión  en reposo. °· Si es posible, eleve la zona de la lesión para reducir la hinchazón. °SOLICITE ATENCIÓN MÉDICA DE INMEDIATO SI:  °· El hematoma o la hinchazón aumentan. °· Siente dolor que empeora. °· La hinchazón o el dolor no se alivian con los medicamentos. °ASEGÚRESE DE QUE:  °· Comprende estas instrucciones. °· Controlará su afección. °· Recibirá ayuda de inmediato si no mejora o si empeora. °Document Released: 09/05/2005 Document Revised: 12/01/2013 °ExitCare® Patient Information ©2015 [employer]. This information is not intended to replace advice given to you by your health care provider. Make sure you discuss any questions you have with your health care provider. ° °

## 2015-02-20 NOTE — Progress Notes (Signed)
° °  This chart was scribed for Elvina SidleKurt Lauenstein by Luisa DagoPriscilla Tutu, Medical Scribe. This patient was seen in room 5 and the patient's care was started at 8:51 AM.  Subjective:    Patient ID: [name], male    DOB: 06/04/1986, 29 y.o.   MRN: [id number]  Chief Complaint  Patient presents with   Back Pain    HPI Matthew [name] [name] [name] is a 29 y.o. male who presents to the office complaining of persistent back pain localized to his lower back in lumbar region following a fall in 02/07/15. Pt was seen at the ED on the day of the fall, but no fractures were seen on x-ray. He states that the pain is worsened by getting out of the car and movement. 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 He states that he needs a couple of paperwork filled out in order to qualify for disability.   Pt states that all urinary symptoms he was experiencing when he was last seen by me on 02/13/2015.  Review of Systems  Constitutional: Negative for fever.  Respiratory: Negative for shortness of breath.   Cardiovascular: Negative for chest pain and leg swelling.  Gastrointestinal: Negative for abdominal pain, constipation and abdominal distention.  Genitourinary: Negative for dysuria, urgency, frequency, flank pain and difficulty urinating.  Musculoskeletal: Positive for back pain. Negative for joint swelling and gait problem.  Skin: Negative for rash.  Neurological: Negative for weakness and numbness.   BP 118/80 mmHg   Pulse 76   Temp(Src) 97.9 F (36.6 C) (Oral)   Resp 14   Ht 5\' 5"  (1.651 m)   Wt 167 lb (75.751 kg)   BMI 27.79 kg/m2   SpO2 100% Objective:   Physical Exam  Vitals reviewed. General: Well-developed, well-nourished male in no acute distress; appearance consistent with age of record HENT: normocephalic; atraumatic Eyes: pupils equal, round and reactive to light; extraocular muscles intact Neck: supple Heart: regular rate and rhythm;  no murmurs, rubs or gallops Lungs: clear to auscultation bilaterally Abdomen: soft; nondistended; nontender; no masses or hepatosplenomegaly; bowel sounds present Extremities: No deformity; full range of motion; pulses normal Neurologic: Awake, alert and oriented; motor function intact in all extremities and symmetric; no facial droop Skin: Warm and dry Psychiatric: Normal mood and affect Patient has pain with light touch of both sides of his back, more so on the right. He has mild straight leg raising signs when extending his right leg.  There is no ecchymosis, bony abnormality of his back. Of interest is the fact that the significant other(woman) sees in the room was taking pictures throughout the interview  Assessment & Plan:   1. Right-sided low back pain with sciatica, sciatica laterality unspecified   2. Left-sided low back pain with left-sided sciatica     This chart was scribed in my presence and reviewed by me personally.    ICD-9-CM ICD-10-CM   1. Right-sided low back pain with sciatica, sciatica laterality unspecified 724.3 M54.41 meloxicam (MOBIC) 7.5 MG tablet     HYDROcodone-acetaminophen (NORCO) 5-325 MG per tablet     Ambulatory referral to Orthopedic Surgery  2. Left-sided low back pain with left-sided sciatica 724.3 M54.42 meloxicam (MOBIC) 7.5 MG tablet     HYDROcodone-acetaminophen (NORCO) 5-325 MG per tablet     Ambulatory referral to Orthopedic Surgery     Signed, Elvina SidleKurt Lauenstein, MD

## 2015-03-15 ENCOUNTER — Other Ambulatory Visit: Payer: Self-pay

## 2015-03-15 DIAGNOSIS — M5441 Lumbago with sciatica, right side: Secondary | ICD-10-CM

## 2015-03-15 DIAGNOSIS — M5442 Lumbago with sciatica, left side: Secondary | ICD-10-CM

## 2015-03-15 MED ORDER — HYDROCODONE-ACETAMINOPHEN 5-325 MG PO TABS: 1.0000 | ORAL_TABLET | Freq: Four times a day (QID) | ORAL | Status: DC | PRN

## 2015-03-15 NOTE — Telephone Encounter (Signed)
Patient needs needs medication refill for hydrocodone. Please call when ready for pick up. [phone]

## 2015-03-16 NOTE — Telephone Encounter (Signed)
Notified pt Rx ready. 

## 2015-04-28 ENCOUNTER — Telehealth: Payer: Self-pay

## 2015-04-28 DIAGNOSIS — M5442 Lumbago with sciatica, left side: Secondary | ICD-10-CM

## 2015-04-28 DIAGNOSIS — M5441 Lumbago with sciatica, right side: Secondary | ICD-10-CM

## 2015-04-28 NOTE — Telephone Encounter (Signed)
Pt is calling for a refill on "pain medicine", he states [facility] on [address] advised him he had to call us for refill

## 2015-04-28 NOTE — Telephone Encounter (Signed)
HYDROcodone-acetaminophen (NORCO) 5-325 MG per tablet 30 tablet 0 03/15/2015     Sig - Route:  Take 1 tablet by mouth every 6 (six) hours as needed for moderate pain. - Oral    Class:  Print    DAW:  No

## 2015-04-29 MED ORDER — HYDROCODONE-ACETAMINOPHEN 5-325 MG PO TABS: 1.0000 | ORAL_TABLET | Freq: Four times a day (QID) | ORAL | Status: DC | PRN

## 2015-09-29 ENCOUNTER — Ambulatory Visit (INDEPENDENT_AMBULATORY_CARE_PROVIDER_SITE_OTHER): Payer: BLUE CROSS/BLUE SHIELD | Admitting: Family Medicine

## 2015-09-29 VITALS — BP 106/80 | HR 70 | Temp 98.0°F | Resp 16 | Ht 65.0 in | Wt 170.6 lb

## 2015-09-29 DIAGNOSIS — Z23 Encounter for immunization: Secondary | ICD-10-CM | POA: Diagnosis not present

## 2015-09-29 DIAGNOSIS — M545 Low back pain, unspecified: Secondary | ICD-10-CM

## 2015-09-29 DIAGNOSIS — G8929 Other chronic pain: Secondary | ICD-10-CM

## 2015-09-29 MED ORDER — PREDNISONE 20 MG PO TABS: ORAL_TABLET | ORAL | Status: DC

## 2015-09-29 NOTE — Progress Notes (Signed)
Subjective:  This chart was scribed for Elvina Sidle MD, by Veverly Fells, at [facility].  This patient was seen in room 5 and the patient's care was started at 10:17 AM.    Patient ID: [name], male    DOB: [birth year], 29 y.o.   MRN: [id number] Chief Complaint  Patient presents with   Back Pain    x 1 week, middle lower back   Immunizations    flu    HPI HPI Comments: [name] is a 29 y.o. male who presents to the [facility] complaining of mid lower back pain onset 1 week ago. He states that he has taken medication (Meloxicam and Hydrocodone)  for his pain but denies any relief.  His pain initially started in February and they will not allow him back at work until his pain is better under control.  He is not able to go to the physical therapist due to the cost. He is requesting a note for work. 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 No pertinent past surgical history. No Known Allergies Prior to Admission medications   Medication Sig Start Date End Date Taking? Authorizing Provider  HYDROcodone-acetaminophen (NORCO) 5-325 MG per tablet Take 1 tablet by mouth every 6 (six) hours as needed for moderate pain. Patient not taking: Reported on 09/29/2015 04/29/15   Elvina Sidle, MD  meloxicam (MOBIC) 7.5 MG tablet Take 1 tablet (7.5 mg total) by mouth daily. Patient not taking: Reported on 09/29/2015 02/20/15   Elvina Sidle, MD  traMADol (ULTRAM) 50 MG tablet Take 1 tablet (50 mg total) by mouth every 8 (eight) hours as needed. Patient not taking: Reported on 09/29/2015 02/13/15   Elvina Sidle, MD   Social History   Social History   Marital Status:  Married    Spouse Name: N/A   Number of Children: N/A   Years of Education: N/A   Occupational History   Not on file.   Social History Main Topics   Smoking status: Former Smoker    Types: Cigarettes    Quit date: 07/24/2012   Smokeless tobacco: Never Used   Alcohol Use: No   Drug Use: No   Sexual Activity: Yes   Other Topics Concern   Not on file   Social History Narrative       Review of Systems  Constitutional: Negative for fever and chills.  Eyes: Negative for pain, redness and itching.  Respiratory: Negative for cough and choking.   Gastrointestinal: Negative for nausea and vomiting.  Musculoskeletal: Positive for back pain.  Neurological: Negative for speech difficulty.       Objective:   Physical Exam  Constitutional: He is oriented to person, place, and time. He appears well-developed and well-nourished. No distress.  HENT:  Head: Normocephalic and atraumatic.  Eyes: Pupils are equal, round, and reactive to light.  Pulmonary/Chest: No respiratory distress.  Musculoskeletal:  He has postive straight leg raise both sides and when knee is crossing over with twisting of lumbar spine, this movement results in pain as well.    Neurological: He is alert and oriented to person, place, and time.  Skin: Skin is warm and dry.  Filed Vitals:   09/29/15 0845  BP: 106/80  Pulse: 70  Temp: 98 F (36.7 C)  TempSrc: Oral  Resp: 16  Height: 5\' 5"  (1.651 m)  Weight: 170 lb 9.6 oz (77.384 kg)  SpO2: 99%    Patient also has pain with mild minimal touch over both posterior superior iliac crests and lower lumbar spines.      Assessment & Plan:   This chart was scribed in my presence and reviewed by me personally.    ICD-9-CM ICD-10-CM   1. Chronic low back pain 724.2 M54.5 predniSONE (DELTASONE) 20 MG tablet   338.29 G89.29 Ambulatory referral to Orthopedic Surgery   I am not sure that we are going to find an organic cause of this pain. I don't have  specific findings that would suggest a deep or serious cause to this kind of pain.  Signed, Elvina SidleKurt Lauenstein, MD

## 2015-09-29 NOTE — Addendum Note (Signed)
Addended by: Nita SellsSMITH, Jamesina Gaugh S on: 09/29/2015 10:28 AM   Modules accepted: Orders

## 2015-09-29 NOTE — Patient Instructions (Signed)

## 2015-12-06 ENCOUNTER — Ambulatory Visit (INDEPENDENT_AMBULATORY_CARE_PROVIDER_SITE_OTHER): Payer: BLUE CROSS/BLUE SHIELD

## 2015-12-06 ENCOUNTER — Ambulatory Visit (INDEPENDENT_AMBULATORY_CARE_PROVIDER_SITE_OTHER): Payer: BLUE CROSS/BLUE SHIELD | Admitting: Family Medicine

## 2015-12-06 VITALS — BP 120/78 | HR 66 | Temp 97.6°F | Resp 14 | Ht 65.5 in | Wt 161.4 lb

## 2015-12-06 DIAGNOSIS — F32A Depression, unspecified: Secondary | ICD-10-CM

## 2015-12-06 DIAGNOSIS — R079 Chest pain, unspecified: Secondary | ICD-10-CM | POA: Diagnosis not present

## 2015-12-06 DIAGNOSIS — Z0000 Encounter for general adult medical examination without abnormal findings: Secondary | ICD-10-CM

## 2015-12-06 DIAGNOSIS — F329 Major depressive disorder, single episode, unspecified: Secondary | ICD-10-CM | POA: Diagnosis not present

## 2015-12-06 LAB — POCT CBC
Granulocyte percent: 64.9 %G (ref 37–80)
HCT, POC: 47.5 % (ref 43.5–53.7)
Hemoglobin: 17.1 g/dL (ref 14.1–18.1)
Lymph, poc: 2.2 (ref 0.6–3.4)
MCH, POC: 30.6 pg (ref 27–31.2)
MCHC: 35.9 g/dL — AB (ref 31.8–35.4)
MCV: 85.1 fL (ref 80–97)
MID (cbc): 0.6 (ref 0–0.9)
MPV: 7.3 fL (ref 0–99.8)
POC Granulocyte: 5.2 (ref 2–6.9)
POC LYMPH PERCENT: 28.1 %L (ref 10–50)
POC MID %: 7 %M (ref 0–12)
Platelet Count, POC: 304 10*3/uL (ref 142–424)
RBC: 5.58 M/uL (ref 4.69–6.13)
RDW, POC: 13.1 %
WBC: 8 10*3/uL (ref 4.6–10.2)

## 2015-12-06 LAB — COMPLETE METABOLIC PANEL WITH GFR
ALT: 34 U/L (ref 9–46)
AST: 26 U/L (ref 10–40)
Albumin: 4.6 g/dL (ref 3.6–5.1)
Alkaline Phosphatase: 61 U/L (ref 40–115)
BUN: 10 mg/dL (ref 7–25)
CO2: 25 mmol/L (ref 20–31)
Calcium: 10.1 mg/dL (ref 8.6–10.3)
Chloride: 100 mmol/L (ref 98–110)
Creat: 0.99 mg/dL (ref 0.60–1.35)
GFR, Est African American: >89 mL/min (ref >=60)
GFR, Est Non African American: >89 mL/min (ref >=60)
Glucose, Bld: 103 mg/dL — ABNORMAL HIGH (ref 65–99)
Potassium: 3.7 mmol/L (ref 3.5–5.3)
Sodium: 139 mmol/L (ref 135–146)
Total Bilirubin: 0.9 mg/dL (ref 0.2–1.2)
Total Protein: 7.7 g/dL (ref 6.1–8.1)

## 2015-12-06 LAB — POCT URINALYSIS DIP (MANUAL ENTRY)
Blood, UA: NEGATIVE
Glucose, UA: NEGATIVE
Leukocytes, UA: NEGATIVE
Nitrite, UA: NEGATIVE
Protein Ur, POC: 100 — AB
Spec Grav, UA: 1.025
Urobilinogen, UA: 2
pH, UA: 6

## 2015-12-06 LAB — LIPID PANEL
Cholesterol: 113 mg/dL — ABNORMAL LOW (ref 125–200)
HDL: 36 mg/dL — ABNORMAL LOW (ref >=40)
LDL Cholesterol: 58 mg/dL (ref <130)
Total CHOL/HDL Ratio: 3.1 Ratio (ref <=5.0)
Triglycerides: 95 mg/dL (ref <150)
VLDL: 19 mg/dL (ref <30)

## 2015-12-06 LAB — TSH: TSH: 0.824 u[IU]/mL (ref 0.350–4.500)

## 2015-12-06 MED ORDER — FLUOXETINE HCL 20 MG PO TABS: 20.0000 mg | ORAL_TABLET | Freq: Every day | ORAL | Status: DC

## 2015-12-06 NOTE — Patient Instructions (Addendum)
Please return in 2 weeks for follow up.   Health Maintenance, Male A healthy lifestyle and preventative care can promote health and wellness.  Maintain regular health, dental, and eye exams.  Eat a healthy diet. Foods like vegetables, fruits, whole grains, low-fat dairy products, and lean protein foods contain the nutrients you need and are low in calories. Decrease your intake of foods high in solid fats, added sugars, and salt. Get information about a proper diet from your health care provider, if necessary.  Regular physical exercise is one of the most important things you can do for your health. Most adults should get at least 150 minutes of moderate-intensity exercise (any activity that increases your heart rate and causes you to sweat) each week. In addition, most adults need muscle-strengthening exercises on 2 or more days a week.   Maintain a healthy weight. The body mass index (BMI) is a screening tool to identify possible weight problems. It provides an estimate of body fat based on height and weight. Your health care provider can find your BMI and can help you achieve or maintain a healthy weight. For males 20 years and older:  A BMI below 18.5 is considered underweight.  A BMI of 18.5 to 24.9 is normal.  A BMI of 25 to 29.9 is considered overweight.  A BMI of 30 and above is considered obese.  Maintain normal blood lipids and cholesterol by exercising and minimizing your intake of saturated fat. Eat a balanced diet with plenty of fruits and vegetables. Blood tests for lipids and cholesterol should begin at age 68 and be repeated every 5 years. If your lipid or cholesterol levels are high, you are over age 53, or you are at high risk for heart disease, you may need your cholesterol levels checked more frequently.Ongoing high lipid and cholesterol levels should be treated with medicines if diet and exercise are not working.  If you smoke, find out from your health care provider  how to quit. If you do not use tobacco, do not start.  Lung cancer screening is recommended for adults aged 55-80 years who are at high risk for developing lung cancer because of a history of smoking. A yearly low-dose CT scan of the lungs is recommended for people who have at least a 30-pack-year history of smoking and are current smokers or have quit within the past 15 years. A pack year of smoking is smoking an average of 1 pack of cigarettes a day for 1 year (for example, a 30-pack-year history of smoking could mean smoking 1 pack a day for 30 years or 2 packs a day for 15 years). Yearly screening should continue until the smoker has stopped smoking for at least 15 years. Yearly screening should be stopped for people who develop a health problem that would prevent them from having lung cancer treatment.  If you choose to drink alcohol, do not have more than 2 drinks per day. One drink is considered to be 12 oz (360 mL) of beer, 5 oz (150 mL) of wine, or 1.5 oz (45 mL) of liquor.  Avoid the use of street drugs. Do not share needles with anyone. Ask for help if you need support or instructions about stopping the use of drugs.  High blood pressure causes heart disease and increases the risk of stroke. High blood pressure is more likely to develop in:  People who have blood pressure in the end of the normal range (100-139/85-89 mm Hg).  People who are  overweight or obese.  People who are African American.  If you are 3118-439 years of age, have your blood pressure checked every 3-5 years. If you are 29 years of age or older, have your blood pressure checked every year. You should have your blood pressure measured twice--once when you are at a hospital or clinic, and once when you are not at a hospital or clinic. Record the average of the two measurements. To check your blood pressure when you are not at a hospital or clinic, you can use:  An automated blood pressure machine at a pharmacy.  A home  blood pressure monitor.  If you are 145-29 years old, ask your health care provider if you should take aspirin to prevent heart disease.  Diabetes screening involves taking a blood sample to check your fasting blood sugar level. This should be done once every 3 years after age 29 if you are at a normal weight and without risk factors for diabetes. Testing should be considered at a younger age or be carried out more frequently if you are overweight and have at least 1 risk factor for diabetes.  Colorectal cancer can be detected and often prevented. Most routine colorectal cancer screening begins at the age of 29 and continues through age 875. However, your health care provider may recommend screening at an earlier age if you have risk factors for colon cancer. On a yearly basis, your health care provider may provide home test kits to check for hidden blood in the stool. A small camera at the end of a tube may be used to directly examine the colon (sigmoidoscopy or colonoscopy) to detect the earliest forms of colorectal cancer. Talk to your health care provider about this at age 29 when routine screening begins. A direct exam of the colon should be repeated every 5-10 years through age 29, unless early forms of precancerous polyps or small growths are found.  People who are at an increased risk for hepatitis B should be screened for this virus. You are considered at high risk for hepatitis B if:  You were born in a country where hepatitis B occurs often. Talk with your health care provider about which countries are considered high risk.  Your parents were born in a high-risk country and you have not received a shot to protect against hepatitis B (hepatitis B vaccine).  You have HIV or AIDS.  You use needles to inject street drugs.  You live with, or have sex with, someone who has hepatitis B.  You are a man who has sex with other men (MSM).  You get hemodialysis treatment.  You take certain  medicines for conditions like cancer, organ transplantation, and autoimmune conditions.  Hepatitis C blood testing is recommended for all people born from [id number] through 1965 and any individual with known risk factors for hepatitis C.  Healthy men should no longer receive prostate-specific antigen (PSA) blood tests as part of routine cancer screening. Talk to your health care provider about prostate cancer screening.  Testicular cancer screening is not recommended for adolescents or adult males who have no symptoms. Screening includes self-exam, a health care provider exam, and other screening tests. Consult with your health care provider about any symptoms you have or any concerns you have about testicular cancer.  Practice safe sex. Use condoms and avoid high-risk sexual practices to reduce the spread of sexually transmitted infections (STIs).  You should be screened for STIs, including gonorrhea and chlamydia if:  You are  sexually active and are younger than 24 years.  You are older than 24 years, and your health care provider tells you that you are at risk for this type of infection.  Your sexual activity has changed since you were last screened, and you are at an increased risk for chlamydia or gonorrhea. Ask your health care provider if you are at risk.  If you are at risk of being infected with HIV, it is recommended that you take a prescription medicine daily to prevent HIV infection. This is called pre-exposure prophylaxis (PrEP). You are considered at risk if:  You are a man who has sex with other men (MSM).  You are a heterosexual man who is sexually active with multiple partners.  You take drugs by injection.  You are sexually active with a partner who has HIV.  Talk with your health care provider about whether you are at high risk of being infected with HIV. If you choose to begin PrEP, you should first be tested for HIV. You should then be tested every 3 months for as long as  you are taking PrEP.  Use sunscreen. Apply sunscreen liberally and repeatedly throughout the day. You should seek shade when your shadow is shorter than you. Protect yourself by wearing long sleeves, pants, a wide-brimmed hat, and sunglasses year round whenever you are outdoors.  Tell your health care provider of new moles or changes in moles, especially if there is a change in shape or color. Also, tell your health care provider if a mole is larger than the size of a pencil eraser.  A one-time screening for abdominal aortic aneurysm (AAA) and surgical repair of large AAAs by ultrasound is recommended for men aged 65-75 years who are current or former smokers.  Stay current with your vaccines (immunizations).   This information is not intended to replace advice given to you by your health care provider. Make sure you discuss any questions you have with your health care provider.   Document Released: 05/24/2008 Document Revised: 12/17/2014 Document Reviewed: 04/23/2011 Elsevier Interactive Patient Education [employer]

## 2015-12-06 NOTE — Progress Notes (Addendum)
Patient ID: Matthew [name] [name] Mosqueda MRN: [id number], DOB: 07/30/1986 29 y.o. Date of Encounter: 12/06/2015, 9:23 AM  Primary Physician: Elvina SidleLAUENSTEIN,Tiwanda Threats, MD  Chief Complaint: Physical (CPE)  HPI: 29 y.o. y/o male with history noted below here for CPE.  This gentleman comes in very depressed because he split with his girlfriend and she took the 2 children with her. She lives in [place] now. There still having dinner at times and he's planning on seeing the children today. This came about over Thanksgiving.  He works at [employer]. He's got some mild back pain but it's gotten much better since October when he presented here for that. He's off this entire week  Lately he's had some neck pain and right side is gone on for 2-3 days without an injury.  Review of Systems: Consitutional: No fever, chills, fatigue, night sweats, lymphadenopathy, or weight changes. Eyes: No visual changes, eye redness, or discharge. ENT/Mouth: Ears: No otalgia, tinnitus, hearing loss, discharge. Nose: No congestion, rhinorrhea, sinus pain, or epistaxis. Throat: No sore throat, post nasal drip, or teeth pain. Cardiovascular: No CP, palpitations, diaphoresis, DOE, edema, orthopnea, PND. Respiratory: No cough, hemoptysis, SOB, or wheezing. Gastrointestinal: No anorexia, dysphagia, reflux, pain, nausea, vomiting, hematemesis, diarrhea, constipation, BRBPR, or melena. Genitourinary: No dysuria, frequency, urgency, hematuria, incontinence, nocturia, decreased urinary stream, discharge, impotence, or testicular pain/masses. Musculoskeletal: No decreased ROM, myalgias, stiffness, joint swelling, or weakness. Skin: No rash, erythema, lesion changes, pain, warmth, jaundice, or pruritis. Neurological: No headache, dizziness, syncope, seizures, tremors, memory loss, coordination problems, or paresthesias. Psychological: No anxiety, depression, hallucinations, SI/HI. Endocrine: No fatigue, polydipsia,  polyphagia, polyuria, or known diabetes. All other systems were reviewed and are otherwise negative.  Past Medical History  Diagnosis Date  . 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 No pertinent past surgical history.  Home Meds:  Prior to Admission medications   Medication Sig Start Date End Date Taking? Authorizing Provider  predniSONE (DELTASONE) 20 MG tablet Two daily with food Patient not taking: Reported on 12/06/2015 09/29/15   Elvina SidleKurt Lataria Courser, MD    Allergies: No Known Allergies  Social History   Social History  . Marital Status: Married    Spouse Name: N/A  . Number of Children: N/A  . Years of Education: N/A   Occupational History  . Not on file.   Social History Main Topics  . Smoking status: Former Smoker    Types: Cigarettes    Quit date: 07/24/2012  . Smokeless tobacco: Never Used  . Alcohol Use: No  . Drug Use: No  . Sexual Activity: Yes   Other Topics Concern  . Not on file   Social History Narrative    Family History  Problem Relation Age of Onset  . Hypertension Mother     Physical Exam: Blood pressure 120/78, pulse 66, temperature 97.6 F (36.4 C), temperature source Oral, resp. rate 14, height 5' 5.5" (1.664 m), weight 161 lb 6.4 oz (73.211 kg), SpO2 98 %.  BP Readings from Last 3 Encounters:  12/06/15 120/78  09/29/15 106/80  02/20/15 118/80   General: Well developed, well nourished, in no acute distress. HEENT: Normocephalic, atraumatic. Conjunctiva pink, sclera non-icteric. Pupils 2 mm constricting to 1 mm, round, regular, and equally reactive to light and accomodation. EOMI.  Fundi benign   Internal auditory canal clear. TMs with good cone of light and without pathology. Nasal mucosa pink. Nares are without discharge.  No sinus tenderness. Oral mucosa pink. Dentition good. Pharynx without exudate.    Neck: Supple. Trachea midline. No thyromegaly. Full ROM. No  lymphadenopathy. Lungs: Clear to auscultation bilaterally without wheezes, rales, or rhonchi. Breathing is of normal effort and unlabored. Cardiovascular: RRR with S1 S2. No murmurs, rubs, or gallops appreciated. Distal pulses 2+ symmetrically. No carotid or abdominal bruits Abdomen: Soft, non-tender, non-distended with normoactive bowel sounds. No hepatosplenomegaly or masses. No rebound/guarding. No CVA tenderness. Without hernias.   Musculoskeletal: Full range of motion and 5/5 strength throughout. Without swelling, atrophy, tenderness, crepitus, or warmth. Extremities without clubbing, cyanosis, or edema. Calves supple. Skin: Warm and moist without erythema, ecchymosis, wounds, or rash. Neuro: A+Ox3. CN II-XII grossly intact. Moves all extremities spontaneously. Full sensation throughout. Normal gait. DTR 2+ throughout upper and lower extremities. Finger to nose intact. Psych:  Responds to questions appropriately with a sad affect.   Lab Results  Component Value Date   CHOL 130 09/23/2012   Lab Results  Component Value Date   HDL 47 09/23/2012   Lab Results  Component Value Date   LDLCALC 75 09/23/2012   Lab Results  Component Value Date   TRIG 39 09/23/2012   Lab Results  Component Value Date   CHOLHDL 2.8 09/23/2012   No results found for: LDLDIRECT  Results for orders placed or performed in visit on 12/06/15  POCT CBC  Result Value Ref Range   WBC 8.0 4.6 - 10.2 K/uL   Lymph, poc 2.2 0.6 - 3.4   POC LYMPH PERCENT 28.1 10 - 50 %L   MID (cbc) 0.6 0 - 0.9   POC MID % 7.0 0 - 12 %M   POC Granulocyte 5.2 2 - 6.9   Granulocyte percent 64.9 37 - 80 %G   RBC 5.58 4.69 - 6.13 M/uL   Hemoglobin 17.1 14.1 - 18.1 g/dL   HCT, POC 96.0 45.4 - 53.7 %   MCV 85.1 80 - 97 fL   MCH, POC 30.6 27 - 31.2 pg   MCHC 35.9 (A) 31.8 - 35.4 g/dL   RDW, POC 09.8 %   Platelet Count, POC 304 142 - 424 K/uL   MPV 7.3 0 - 99.8 fL  POCT urinalysis dipstick  Result Value Ref Range   Color, UA  yellow yellow   Clarity, UA clear clear   Glucose, UA negative negative   Bilirubin, UA moderate (A) negative   Ketones, POC UA large (80) (A) negative   Spec Grav, UA 1.025    Blood, UA negative negative   pH, UA 6.0    Protein Ur, POC =100 (A) negative   Urobilinogen, UA 2.0    Nitrite, UA Negative Negative   Leukocytes, UA Negative Negative   [facility] reading (PRIMARY) by  Dr. Milus Glazier: chest xray: normal  Assessment/Plan:  29 y.o. y/o  male here for CPE This chart was scribed in my presence and reviewed by me personally.    ICD-9-CM ICD-10-CM   1. Annual physical exam V70.0 Z00.00 POCT CBC     COMPLETE METABOLIC PANEL WITH GFR     Lipid panel     POCT urinalysis dipstick  2. Chest pain, unspecified chest pain type 786.50 R07.9 DG Chest 2 View  3. Depression 311 F32.9 TSH     FLUoxetine (PROZAC) 20 MG tablet     Ambulatory referral to Psychology  follow up 2 weeks   Signed, Elvina Sidle, MD 12/06/2015 9:23 AM

## 2015-12-26 ENCOUNTER — Emergency Department (HOSPITAL_COMMUNITY)
Admission: EM | Admit: 2015-12-26 | Discharge: 2015-12-27 | Disposition: A | Discharge status: Other Acute Inpatient Hospital | Payer: BLUE CROSS/BLUE SHIELD | Attending: Emergency Medicine | Admitting: Emergency Medicine

## 2015-12-26 ENCOUNTER — Encounter (HOSPITAL_COMMUNITY): Payer: Self-pay | Admitting: *Deleted

## 2015-12-26 DIAGNOSIS — R630 Anorexia: Secondary | ICD-10-CM | POA: Diagnosis not present

## 2015-12-26 DIAGNOSIS — Z8619 Personal history of other infectious and parasitic diseases: Secondary | ICD-10-CM | POA: Diagnosis not present

## 2015-12-26 DIAGNOSIS — Z8659 Personal history of other mental and behavioral disorders: Secondary | ICD-10-CM | POA: Diagnosis not present

## 2015-12-26 DIAGNOSIS — T391X2A Poisoning by 4-Aminophenol derivatives, intentional self-harm, initial encounter: Secondary | ICD-10-CM | POA: Diagnosis present

## 2015-12-26 DIAGNOSIS — Y9389 Activity, other specified: Secondary | ICD-10-CM | POA: Diagnosis not present

## 2015-12-26 DIAGNOSIS — Y999 Unspecified external cause status: Secondary | ICD-10-CM | POA: Diagnosis not present

## 2015-12-26 DIAGNOSIS — Z87891 Personal history of nicotine dependence: Secondary | ICD-10-CM | POA: Diagnosis not present

## 2015-12-26 DIAGNOSIS — R000 Tachycardia, unspecified: Secondary | ICD-10-CM | POA: Insufficient documentation

## 2015-12-26 DIAGNOSIS — K7200 Acute and subacute hepatic failure without coma: Secondary | ICD-10-CM | POA: Diagnosis not present

## 2015-12-26 DIAGNOSIS — Y9289 Other specified places as the place of occurrence of the external cause: Secondary | ICD-10-CM | POA: Insufficient documentation

## 2015-12-26 LAB — COMPREHENSIVE METABOLIC PANEL
ALT: 590 U/L — ABNORMAL HIGH (ref 17–63)
ALT: 598 U/L — ABNORMAL HIGH (ref 17–63)
AST: 366 U/L — ABNORMAL HIGH (ref 15–41)
AST: 355 U/L — ABNORMAL HIGH (ref 15–41)
Albumin: 5.5 g/dL — ABNORMAL HIGH (ref 3.5–5.0)
Albumin: 5.3 g/dL — ABNORMAL HIGH (ref 3.5–5.0)
Alkaline Phosphatase: 88 U/L (ref 38–126)
Alkaline Phosphatase: 88 U/L (ref 38–126)
Anion gap: 25 — ABNORMAL HIGH (ref 5–15)
Anion gap: 28 — ABNORMAL HIGH (ref 5–15)
BUN: 7 mg/dL (ref 6–20)
BUN: 8 mg/dL (ref 6–20)
CO2: 11 mmol/L — ABNORMAL LOW (ref 22–32)
CO2: 8 mmol/L — ABNORMAL LOW (ref 22–32)
Calcium: 9.8 mg/dL (ref 8.9–10.3)
Calcium: 9.8 mg/dL (ref 8.9–10.3)
Chloride: 106 mmol/L (ref 101–111)
Chloride: 107 mmol/L (ref 101–111)
Creatinine, Ser: 1.55 mg/dL — ABNORMAL HIGH (ref 0.61–1.24)
Creatinine, Ser: 1.42 mg/dL — ABNORMAL HIGH (ref 0.61–1.24)
GFR calc Af Amer: >60 mL/min (ref >60)
GFR calc Af Amer: >60 mL/min (ref >60)
GFR calc non Af Amer: 59 mL/min — ABNORMAL LOW (ref >60)
GFR calc non Af Amer: >60 mL/min (ref >60)
Glucose, Bld: 134 mg/dL — ABNORMAL HIGH (ref 65–99)
Glucose, Bld: 114 mg/dL — ABNORMAL HIGH (ref 65–99)
Potassium: 3.9 mmol/L (ref 3.5–5.1)
Potassium: 3.4 mmol/L — ABNORMAL LOW (ref 3.5–5.1)
Sodium: 142 mmol/L (ref 135–145)
Sodium: 143 mmol/L (ref 135–145)
Total Bilirubin: 2.1 mg/dL — ABNORMAL HIGH (ref 0.3–1.2)
Total Bilirubin: 2.5 mg/dL — ABNORMAL HIGH (ref 0.3–1.2)
Total Protein: 9.4 g/dL — ABNORMAL HIGH (ref 6.5–8.1)
Total Protein: 8.1 g/dL (ref 6.5–8.1)

## 2015-12-26 LAB — CBC
HCT: 51.9 % (ref 39.0–52.0)
Hemoglobin: 17.9 g/dL — ABNORMAL HIGH (ref 13.0–17.0)
MCH: 30.1 pg (ref 26.0–34.0)
MCHC: 34.5 g/dL (ref 30.0–36.0)
MCV: 87.4 fL (ref 78.0–100.0)
Platelets: 199 10*3/uL (ref 150–400)
RBC: 5.94 MIL/uL — ABNORMAL HIGH (ref 4.22–5.81)
RDW: 13.6 % (ref 11.5–15.5)
WBC: 14.1 10*3/uL — ABNORMAL HIGH (ref 4.0–10.5)

## 2015-12-26 LAB — PROTIME-INR
INR: 2.52 — ABNORMAL HIGH (ref 0.00–1.49)
Prothrombin Time: 26.9 seconds — ABNORMAL HIGH (ref 11.6–15.2)

## 2015-12-26 LAB — I-STAT CG4 LACTIC ACID, ED: Lactic Acid, Venous: 6.94 mmol/L (ref 0.5–2.0)

## 2015-12-26 LAB — I-STAT ARTERIAL BLOOD GAS, ED
Acid-base deficit: 17 mmol/L — ABNORMAL HIGH (ref 0.0–2.0)
Bicarbonate: 8.9 mEq/L — ABNORMAL LOW (ref 20.0–24.0)
O2 Saturation: 97 %
Patient temperature: 98.6
TCO2: 10 mmol/L (ref 0–100)
pCO2 arterial: 22.3 mmHg — ABNORMAL LOW (ref 35.0–45.0)
pH, Arterial: 7.211 — ABNORMAL LOW (ref 7.350–7.450)
pO2, Arterial: 101 mmHg — ABNORMAL HIGH (ref 80.0–100.0)

## 2015-12-26 LAB — ACETAMINOPHEN LEVEL
Acetaminophen (Tylenol), Serum: 426 ug/mL (ref 10–30)
Acetaminophen (Tylenol), Serum: 412 ug/mL (ref 10–30)

## 2015-12-26 LAB — SALICYLATE LEVEL: Salicylate Lvl: <4 mg/dL (ref 2.8–30.0)

## 2015-12-26 LAB — ETHANOL: Alcohol, Ethyl (B): <5 mg/dL (ref <5)

## 2015-12-26 LAB — CBG MONITORING, ED: Glucose-Capillary: 88 mg/dL (ref 65–99)

## 2015-12-26 MED ORDER — ONDANSETRON HCL 4 MG/2ML IJ SOLN
4.0000 mg | Freq: Once | INTRAMUSCULAR | Status: AC
  Administered 2015-12-26: 4 mg via INTRAVENOUS
  Filled 2015-12-26: qty 2

## 2015-12-26 MED ORDER — SODIUM BICARBONATE 8.4 % IV SOLN
INTRAVENOUS | Status: DC
  Administered 2015-12-27: via INTRAVENOUS
  Filled 2015-12-26 (×8): qty 150

## 2015-12-26 MED ORDER — SODIUM CHLORIDE 0.9 % IV BOLUS (SEPSIS)
2000.0000 mL | Freq: Once | INTRAVENOUS | Status: AC
  Administered 2015-12-26: 2000 mL via INTRAVENOUS

## 2015-12-26 MED ORDER — ACETYLCYSTEINE 20 % IN SOLN
140.0000 mg/kg | Freq: Once | RESPIRATORY_TRACT | Status: AC
  Administered 2015-12-26: 10480 mg via ORAL
  Filled 2015-12-26: qty 60

## 2015-12-26 MED ORDER — ACETYLCYSTEINE 20 % IN SOLN
70.0000 mg/kg | RESPIRATORY_TRACT | Status: DC
  Filled 2015-12-26 (×3): qty 30

## 2015-12-26 NOTE — ED Notes (Signed)
Pt states he took 200 tylenol pills last night in an attempt to hurt himself and get attention. Pt reports vomiting 2-3 times since last night. Pt reports left and right flank pain. Pt denies abdominal pain. Pt denies SI/HI at this time.

## 2015-12-26 NOTE — ED Notes (Signed)
Sitter has been called.  

## 2015-12-26 NOTE — ED Notes (Signed)
Pt girlfriend has belongings, states she will be taking them home.

## 2015-12-26 NOTE — ED Notes (Signed)
Pt has been wanded 

## 2015-12-26 NOTE — ED Notes (Signed)
Sitter at bedside at this time 

## 2015-12-26 NOTE — ED Notes (Signed)
Pt is aware urine is needed, pt is unable to urinate at this time. 

## 2015-12-26 NOTE — ED Notes (Signed)
Contacted [facility] liver transplant team: Dr. Fritzi MandesAlvira Norunha (sp?)

## 2015-12-26 NOTE — ED Provider Notes (Signed)
CSN: [id number]     Arrival date & time 12/26/15  1742 History   First MD Initiated Contact with Patient 12/26/15 1857     Chief Complaint  Patient presents with  . Drug Overdose     (Consider location/radiation/quality/duration/timing/severity/associated sxs/prior Treatment) HPI Comments: 30 year old male with history of depression presents following a Tylenol overdose. The patient reports that last night around 7 PM he took about 200 tablets of Tylenol. He said he was trying to hurt himself or end his life. He said he was looking for attention. He reports that since last night he's vomited 2 or 3 times mainly this morning. He also reports some mild abdominal discomfort. He says he feels extremely dehydrated. Denies taking any other substances.   Past Medical History  Diagnosis Date  . 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 No pertinent past surgical history. Family History  Problem Relation Age of Onset  . Hypertension Mother    Social History  Substance Use Topics  . Smoking status: Former Smoker    Types: Cigarettes    Quit date: 07/24/2012  . Smokeless tobacco: Never Used  . Alcohol Use: No    Review of Systems  Constitutional: Positive for appetite change and fatigue.  HENT: Negative for congestion, postnasal drip, rhinorrhea and trouble swallowing.   Eyes: Negative for pain and visual disturbance.  Respiratory: Negative for cough, chest tightness and shortness of breath.   Cardiovascular: Negative for chest pain and palpitations.  Gastrointestinal: Positive for nausea, vomiting and abdominal pain. Negative for diarrhea and constipation.  Genitourinary: Negative for dysuria, urgency and hematuria.  Musculoskeletal: Negative for myalgias and back pain.  Skin: Positive for color change.  Neurological: Negative for dizziness, weakness, light-headedness and headaches.  Hematological: Does not  bruise/bleed easily.      Allergies  Review of patient's allergies indicates no known allergies.  Home Medications   Prior to Admission medications   Medication Sig Start Date End Date Taking? Authorizing Provider  FLUoxetine (PROZAC) 20 MG tablet Take 1 tablet (20 mg total) by mouth daily. Patient not taking: Reported on 12/26/2015 12/06/15   Elvina Sidle, MD   BP 124/85 mmHg  Pulse 97  Temp(Src) 97.6 F (36.4 C) (Oral)  Resp 25  Wt 165 lb (74.844 kg)  SpO2 97% Physical Exam  Constitutional: He is oriented to person, place, and time. He appears well-developed and well-nourished. No distress.  HENT:  Head: Normocephalic and atraumatic.  Right Ear: External ear normal.  Left Ear: External ear normal.  Mouth/Throat: Oropharynx is clear and moist. Mucous membranes are dry. No oropharyngeal exudate.  Eyes: EOM are normal. Pupils are equal, round, and reactive to light. Scleral icterus is present.  Neck: Normal range of motion. Neck supple.  Cardiovascular: Regular rhythm, normal heart sounds and intact distal pulses.  Tachycardia present.   No murmur heard. Pulmonary/Chest: Effort normal. No respiratory distress. He has no wheezes. He has no rales.  Abdominal: Soft. He exhibits no distension. There is no tenderness.  Musculoskeletal: He exhibits no edema.  Neurological: He is alert and oriented to person, place, and time.  Skin: Skin is warm and dry. No rash noted. He is not diaphoretic.  jaundiced  Vitals reviewed.   ED Course  Procedures (including critical care time)  CRITICAL CARE Performed by: Larna Daughters   Total critical care time: 45 minutes  Critical care time was exclusive of separately billable procedures  and treating other patients.  Critical care was necessary to treat or prevent imminent or life-threatening deterioration.  Critical care was time spent personally by me on the following activities: development of treatment plan with patient and/or  surrogate as well as nursing, discussions with consultants, evaluation of patient's response to treatment, examination of patient, obtaining history from patient or surrogate, ordering and performing treatments and interventions, ordering and review of laboratory studies, ordering and review of radiographic studies, pulse oximetry and re-evaluation of patient's condition.   Labs Review Labs Reviewed  COMPREHENSIVE METABOLIC PANEL - Abnormal; Notable for the following:    CO2 11 (*)    Glucose, Bld 134 (*)    Creatinine, Ser 1.55 (*)    Total Protein 9.4 (*)    Albumin 5.5 (*)    AST 366 (*)    ALT 590 (*)    Total Bilirubin 2.1 (*)    GFR calc non Af Amer 59 (*)    Anion gap 25 (*)    All other components within normal limits  ACETAMINOPHEN LEVEL - Abnormal; Notable for the following:    Acetaminophen (Tylenol), Serum 426 (*)    All other components within normal limits  CBC - Abnormal; Notable for the following:    WBC 14.1 (*)    RBC 5.94 (*)    Hemoglobin 17.9 (*)    All other components within normal limits  ACETAMINOPHEN LEVEL - Abnormal; Notable for the following:    Acetaminophen (Tylenol), Serum 412 (*)    All other components within normal limits  COMPREHENSIVE METABOLIC PANEL - Abnormal; Notable for the following:    Potassium 3.4 (*)    CO2 8 (*)    Glucose, Bld 114 (*)    Creatinine, Ser 1.42 (*)    Albumin 5.3 (*)    AST 355 (*)    ALT 598 (*)    Total Bilirubin 2.5 (*)    Anion gap 28 (*)    All other components within normal limits  PROTIME-INR - Abnormal; Notable for the following:    Prothrombin Time 26.9 (*)    INR 2.52 (*)    All other components within normal limits  BASIC METABOLIC PANEL - Abnormal; Notable for the following:    Chloride 113 (*)    CO2 14 (*)    Glucose, Bld 141 (*)    Creatinine, Ser 1.28 (*)    Calcium 8.7 (*)    All other components within normal limits  HEPATIC FUNCTION PANEL - Abnormal; Notable for the following:    AST 309  (*)    ALT 550 (*)    Total Bilirubin 2.5 (*)    Bilirubin, Direct 1.2 (*)    Indirect Bilirubin 1.3 (*)    All other components within normal limits  I-STAT CG4 LACTIC ACID, ED - Abnormal; Notable for the following:    Lactic Acid, Venous 6.94 (*)    All other components within normal limits  I-STAT ARTERIAL BLOOD GAS, ED - Abnormal; Notable for the following:    pH, Arterial 7.211 (*)    pCO2 arterial 22.3 (*)    pO2, Arterial 101.0 (*)    Bicarbonate 8.9 (*)    Acid-base deficit 17.0 (*)    All other components within normal limits  ETHANOL  SALICYLATE LEVEL  URINE RAPID DRUG SCREEN, HOSP PERFORMED  BLOOD GAS, ARTERIAL  CBG MONITORING, ED  I-STAT CG4 LACTIC ACID, ED    Imaging Review No results found. I have personally reviewed  and evaluated these images and lab results as part of my medical decision-making.   EKG Interpretation None      MDM  Patient was seen and evaluated at bedside. Patient with severe Tylenol overdose.  Tylenol level here at roughly 24 hours was 426. Patient was elevated AST and ALT. PT/INR also elevated. Patient with metabolic acidosis with decreased bicarbonate and increased anion gap. Poison control was consult sit immediately. They recommended IV fluid hydration. NAC was initiated immediately.  Discussed on the phone the case with our intensivist on call who recommended the patient be transferred to a liver transplant center. Case was discussed with the on-call coordinator for the [facility] liver transplant team who said that the hepatologist recommended that we discuss this case with the medical admitting staff. Case was discussed with the on-call MICU fellow at [facility] who accepted the patient in transfer. Patient was updated on all results and plan of care. D5 water with bicarbonate was initiated per [facility] recommendations.  Patient transferred for further treatment. Final diagnoses:  Tylenol overdose, intentional self-harm, initial encounter (HCC)  Acute  liver failure    1.  Tylenol Overdose  2. Acute liver failure    Leta Baptist, MD 12/27/15 [phone]

## 2015-12-27 DIAGNOSIS — Y999 Unspecified external cause status: Secondary | ICD-10-CM | POA: Diagnosis not present

## 2015-12-27 DIAGNOSIS — T391X2A Poisoning by 4-Aminophenol derivatives, intentional self-harm, initial encounter: Secondary | ICD-10-CM | POA: Diagnosis present

## 2015-12-27 DIAGNOSIS — Z8659 Personal history of other mental and behavioral disorders: Secondary | ICD-10-CM | POA: Diagnosis not present

## 2015-12-27 DIAGNOSIS — Y9289 Other specified places as the place of occurrence of the external cause: Secondary | ICD-10-CM | POA: Diagnosis not present

## 2015-12-27 DIAGNOSIS — R630 Anorexia: Secondary | ICD-10-CM | POA: Diagnosis not present

## 2015-12-27 DIAGNOSIS — Z87891 Personal history of nicotine dependence: Secondary | ICD-10-CM | POA: Diagnosis not present

## 2015-12-27 DIAGNOSIS — K7200 Acute and subacute hepatic failure without coma: Secondary | ICD-10-CM | POA: Diagnosis not present

## 2015-12-27 DIAGNOSIS — Z8619 Personal history of other infectious and parasitic diseases: Secondary | ICD-10-CM | POA: Diagnosis not present

## 2015-12-27 DIAGNOSIS — Y9389 Activity, other specified: Secondary | ICD-10-CM | POA: Diagnosis not present

## 2015-12-27 DIAGNOSIS — R000 Tachycardia, unspecified: Secondary | ICD-10-CM | POA: Diagnosis not present

## 2015-12-27 LAB — BASIC METABOLIC PANEL
Anion gap: 14 (ref 5–15)
BUN: 7 mg/dL (ref 6–20)
CO2: 14 mmol/L — ABNORMAL LOW (ref 22–32)
Calcium: 8.7 mg/dL — ABNORMAL LOW (ref 8.9–10.3)
Chloride: 113 mmol/L — ABNORMAL HIGH (ref 101–111)
Creatinine, Ser: 1.28 mg/dL — ABNORMAL HIGH (ref 0.61–1.24)
GFR calc Af Amer: >60 mL/min (ref >60)
GFR calc non Af Amer: >60 mL/min (ref >60)
Glucose, Bld: 141 mg/dL — ABNORMAL HIGH (ref 65–99)
Potassium: 4.4 mmol/L (ref 3.5–5.1)
Sodium: 141 mmol/L (ref 135–145)

## 2015-12-27 LAB — HEPATIC FUNCTION PANEL
ALT: 550 U/L — ABNORMAL HIGH (ref 17–63)
AST: 309 U/L — ABNORMAL HIGH (ref 15–41)
Albumin: 4.3 g/dL (ref 3.5–5.0)
Alkaline Phosphatase: 76 U/L (ref 38–126)
Bilirubin, Direct: 1.2 mg/dL — ABNORMAL HIGH (ref 0.1–0.5)
Indirect Bilirubin: 1.3 mg/dL — ABNORMAL HIGH (ref 0.3–0.9)
Total Bilirubin: 2.5 mg/dL — ABNORMAL HIGH (ref 0.3–1.2)
Total Protein: 6.9 g/dL (ref 6.5–8.1)

## 2015-12-27 LAB — RAPID URINE DRUG SCREEN, HOSP PERFORMED
Amphetamines: NOT DETECTED
Barbiturates: NOT DETECTED
Benzodiazepines: NOT DETECTED
Cocaine: NOT DETECTED
Opiates: NOT DETECTED
Tetrahydrocannabinol: NOT DETECTED

## 2015-12-27 NOTE — ED Notes (Signed)
Pt resting quietly at this time.  Pt's mother remains at bedside.  Sitter also at bedside

## 2015-12-28 DIAGNOSIS — D689 Coagulation defect, unspecified: Secondary | ICD-10-CM | POA: Insufficient documentation

## 2015-12-28 DIAGNOSIS — K7290 Hepatic failure, unspecified without coma: Secondary | ICD-10-CM | POA: Insufficient documentation

## 2016-01-12 ENCOUNTER — Telehealth: Payer: Self-pay | Admitting: Family Medicine

## 2016-01-12 NOTE — Telephone Encounter (Signed)
Patient taken overdose of Tylenol 2 weeks ago. He was admitted at [facility] and is now in psychiatry. His liver enzymes are returning to normal slowly and is expected to fully recover. He'll be coming in to see me next week

## 2016-01-20 ENCOUNTER — Ambulatory Visit (INDEPENDENT_AMBULATORY_CARE_PROVIDER_SITE_OTHER): Payer: BLUE CROSS/BLUE SHIELD | Admitting: Family Medicine

## 2016-01-20 VITALS — BP 116/70 | HR 67 | Temp 98.1°F | Resp 16 | Ht 65.0 in | Wt 155.8 lb

## 2016-01-20 DIAGNOSIS — T391X2A Poisoning by 4-Aminophenol derivatives, intentional self-harm, initial encounter: Secondary | ICD-10-CM | POA: Diagnosis not present

## 2016-01-20 LAB — HEPATIC FUNCTION PANEL
ALT: 43 U/L (ref 9–46)
AST: 73 U/L — ABNORMAL HIGH (ref 10–40)
Albumin: 3.4 g/dL — ABNORMAL LOW (ref 3.6–5.1)
Alkaline Phosphatase: 133 U/L — ABNORMAL HIGH (ref 40–115)
Bilirubin, Direct: 7.4 mg/dL — ABNORMAL HIGH (ref <=0.2)
Indirect Bilirubin: 7.4 mg/dL — ABNORMAL HIGH (ref 0.2–1.2)
Total Bilirubin: 14.8 mg/dL — ABNORMAL HIGH (ref 0.2–1.2)
Total Protein: 6.7 g/dL (ref 6.1–8.1)

## 2016-01-20 LAB — LITHIUM LEVEL: Lithium Lvl: 0.3 mEq/L — ABNORMAL LOW (ref 0.80–1.40)

## 2016-01-20 LAB — PROTIME-INR
INR: 1.17 (ref <1.50)
Prothrombin Time: 15.1 seconds (ref 11.6–15.2)

## 2016-01-20 NOTE — Patient Instructions (Signed)
Please return Wednesday afternoon after 2 pm

## 2016-01-20 NOTE — Progress Notes (Signed)
 @  By signing my name below, I, [name], attest that this documentation has been prepared under the direction and in the presence of Matthew Sidle, MD.  Electronically Signed: Andrew [name], ED Scribe. 01/20/2016. 8:38 AM.  Patient ID: [name] MRN: [id number], DOB: 07-30-1986, 30 y.o. Date of Encounter: 01/20/2016, 8:13 AM  Primary Physician: Matthew Sidle, MD  Chief Complaint:  Chief Complaint  Patient presents with  . Follow-up    physical    HPI: 30 y.o. year old male with history below presents for a follow up. Pt was seen 1 month ago in the ED for tylenol overdose and was admitted at [facility] for acute liver failure. States he split up with his wife one month ago and overdosed on tylenol. He has reconcile somewhat with his wife. Pt felt as if he did this on an impulse. He reports having his family as support. He denies suicidal ideation.   Past Medical History  Diagnosis Date  . 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 Authorizing Provider  FLUoxetine (PROZAC) 20 MG tablet Take 1 tablet (20 mg total) by mouth daily. Patient not taking: Reported on 12/26/2015 12/06/15   Matthew Sidle, MD    Allergies: No Known Allergies  Social History   Social History  . Marital Status: Married    Spouse Name: N/A  . Number of Children: N/A  . Years of Education: N/A   Occupational History  . Not on file.   Social History Main Topics  . Smoking status: Former Smoker    Types: Cigarettes    Quit date: 07/24/2012  . Smokeless tobacco: Never Used  . Alcohol Use: No  . Drug Use: No  . Sexual Activity: Yes   Other Topics Concern  . Not on file   Social History Narrative     Review of Systems: Constitutional: negative for chills, fever, night sweats, weight changes, or fatigue  HEENT: negative for vision  changes, hearing loss, congestion, rhinorrhea, ST, epistaxis, or sinus pressure Cardiovascular: negative for chest pain or palpitations Respiratory: negative for hemoptysis, wheezing, shortness of breath, or cough Abdominal: negative for abdominal pain, nausea, vomiting, diarrhea, or constipation Dermatological: negative for rash Neurologic: negative for headache, dizziness, or syncope All other systems reviewed and are otherwise negative with the exception to those above and in the HPI.   Physical Exam: Blood pressure 116/70, pulse 67, temperature 98.1 F (36.7 C), temperature source Oral, resp. rate 16, height  (1.651 m), weight 155 lb 12.8 oz (70.67 kg), SpO2 98 %., Body mass index is 25.93 kg/(m^2). General: Well developed, well nourished, in no acute distress. Head: Normocephalic, atraumatic, eyes without discharge, sclera non-icteric, nares are without discharge. Bilateral auditory canals clear, TM's are without perforation, pearly grey and translucent with reflective cone of light bilaterally. Oral cavity moist, posterior pharynx without exudate, erythema, peritonsillar abscess, or post nasal drip.  Neck: Supple. No thyromegaly. Full ROM. No lymphadenopathy. Lungs: Clear bilaterally to auscultation without wheezes, rales, or rhonchi. Breathing is unlabored. Heart: RRR with S1 S2. No murmurs, rubs, or gallops appreciated. Abdomen: Soft, non-tender, non-distended with normoactive bowel sounds. No hepatomegaly. No rebound/guarding. No obvious abdominal masses. Msk:  Strength and tone normal for age. Extremities/Skin: Warm and dry. No clubbing or cyanosis. No edema. No rashes or suspicious lesions. Markedly jaundice and icterus . No ecchymosis.  Neuro: Alert and oriented X 3. Moves all extremities spontaneously. Gait is normal. CNII-XII grossly in tact. Psych:  Responds to questions appropriately with a normal affect. we spent 15 minutes talking about his recent suicide attempt, his  family supports, and his continued engagement with his children and wife.    ASSESSMENT AND PLAN:  8 y.o. year old male with recent suicide attempt. I feel terrible that patient did not follow-up with me after his physical last December. He still is very precarious situation with his jaundice. I will direct him to see Dr. Donell Beers at [facility] and I will follow-up with him next Wednesday. He does seem much more centered now and not suicidal. This chart was scribed in my presence and reviewed by me personally.    ICD-9-CM ICD-10-CM   1. Overdose by acetaminophen, intentional self-harm, initial encounter (HCC) 965.4 T39.1X2A Hepatic function panel   E950.0  Protime-INR     Lithium level     Signed, Matthew Sidle, MD 01/20/2016 8:13 AM

## 2016-01-21 ENCOUNTER — Encounter: Payer: Self-pay | Admitting: Family Medicine

## 2016-01-21 ENCOUNTER — Other Ambulatory Visit: Payer: Self-pay | Admitting: Family Medicine

## 2016-01-21 DIAGNOSIS — K7200 Acute and subacute hepatic failure without coma: Secondary | ICD-10-CM

## 2016-01-25 ENCOUNTER — Ambulatory Visit (INDEPENDENT_AMBULATORY_CARE_PROVIDER_SITE_OTHER): Payer: BLUE CROSS/BLUE SHIELD | Admitting: Family Medicine

## 2016-01-25 VITALS — BP 118/70 | HR 69 | Temp 98.7°F | Resp 16 | Ht 65.0 in | Wt 155.2 lb

## 2016-01-25 DIAGNOSIS — K7200 Acute and subacute hepatic failure without coma: Secondary | ICD-10-CM | POA: Diagnosis not present

## 2016-01-25 DIAGNOSIS — T391X2D Poisoning by 4-Aminophenol derivatives, intentional self-harm, subsequent encounter: Secondary | ICD-10-CM | POA: Diagnosis not present

## 2016-01-25 MED ORDER — LITHIUM CARBONATE ER 300 MG PO TBCR: 300.0000 mg | EXTENDED_RELEASE_TABLET | Freq: Two times a day (BID) | ORAL | Status: DC

## 2016-01-25 NOTE — Patient Instructions (Addendum)
Results for orders placed or performed in visit on 01/20/16  Hepatic function panel  Result Value Ref Range   Total Bilirubin 14.8 (H) 0.2 - 1.2 mg/dL   Bilirubin, Direct 7.4 (H) <=0.2 mg/dL   Indirect Bilirubin 7.4 (H) 0.2 - 1.2 mg/dL   Alkaline Phosphatase 133 (H) 40 - 115 U/L   AST 73 (H) 10 - 40 U/L   ALT 43 9 - 46 U/L   Total Protein 6.7 6.1 - 8.1 g/dL   Albumin 3.4 (L) 3.6 - 5.1 g/dL  Protime-INR  Result Value Ref Range   Prothrombin Time 15.1 11.6 - 15.2 seconds   INR 1.17 <1.50  Lithium level  Result Value Ref Range   Lithium Lvl 0.30 (L) 0.80 - 1.40 mEq/L   Let's recheck you in one week, Wednesday 22 February from 8 to 4 is good.  Please call your psychiatrist and let me know if he will continue seeing you.

## 2016-01-25 NOTE — Progress Notes (Signed)
Some itching, some asterixis.  Appetite average.  Not suicidal.  Working out issues with wife and kids well.  Still not working.  He's had a dry cough lately without fever.  Objective:  BP 118/70 mmHg  Pulse 69  Temp(Src) 98.7 F (37.1 C) (Oral)  Resp 16  Ht  (1.651 m)  Wt 155 lb 3.2 oz (70.398 kg)  BMI 25.83 kg/m2  SpO2 99%    Results for orders placed or performed in visit on 01/20/16  Hepatic function panel  Result Value Ref Range   Total Bilirubin 14.8 (H) 0.2 - 1.2 mg/dL   Bilirubin, Direct 7.4 (H) <=0.2 mg/dL   Indirect Bilirubin 7.4 (H) 0.2 - 1.2 mg/dL   Alkaline Phosphatase 133 (H) 40 - 115 U/L   AST 73 (H) 10 - 40 U/L   ALT 43 9 - 46 U/L   Total Protein 6.7 6.1 - 8.1 g/dL   Albumin 3.4 (L) 3.6 - 5.1 g/dL  Protime-INR  Result Value Ref Range   Prothrombin Time 15.1 11.6 - 15.2 seconds   INR 1.17 <1.50  Lithium level  Result Value Ref Range   Lithium Lvl 0.30 (L) 0.80 - 1.40 mEq/L   Patient has intention tremor Chest:  Clear Heart: reg, no murmur Abdomen:  Soft, nontender without hepatomegaly Patient is less icteric today     ICD-9-CM ICD-10-CM   1. Tylenol overdose, intentional self-harm, subsequent encounter V58.89 T39.1X2D Hepatic function panel   965.4  lithium carbonate (LITHOBID) 300 MG CR tablet  2. Acute hepatitis, noninfective 570 K72.00 Hepatic function panel   Take Hall's drops for the cough.  Call Psychiatrist for follow up and let me know if he won't see you. Recheck one week.  Signed, Elvina Sidle, MD

## 2016-01-26 LAB — HEPATIC FUNCTION PANEL
ALT: 48 U/L — ABNORMAL HIGH (ref 9–46)
AST: 57 U/L — ABNORMAL HIGH (ref 10–40)
Albumin: 3.4 g/dL — ABNORMAL LOW (ref 3.6–5.1)
Alkaline Phosphatase: 145 U/L — ABNORMAL HIGH (ref 40–115)
Bilirubin, Direct: 4.1 mg/dL — ABNORMAL HIGH (ref <=0.2)
Indirect Bilirubin: 4 mg/dL — ABNORMAL HIGH (ref 0.2–1.2)
Total Bilirubin: 8.1 mg/dL — ABNORMAL HIGH (ref 0.2–1.2)
Total Protein: 6.6 g/dL (ref 6.1–8.1)

## 2016-02-01 ENCOUNTER — Ambulatory Visit (INDEPENDENT_AMBULATORY_CARE_PROVIDER_SITE_OTHER): Payer: BLUE CROSS/BLUE SHIELD | Admitting: Family Medicine

## 2016-02-01 VITALS — BP 122/78 | HR 101 | Temp 98.7°F | Resp 18 | Wt 157.2 lb

## 2016-02-01 DIAGNOSIS — K759 Inflammatory liver disease, unspecified: Secondary | ICD-10-CM

## 2016-02-01 DIAGNOSIS — T391X2D Poisoning by 4-Aminophenol derivatives, intentional self-harm, subsequent encounter: Secondary | ICD-10-CM | POA: Diagnosis not present

## 2016-02-01 DIAGNOSIS — T50905A Adverse effect of unspecified drugs, medicaments and biological substances, initial encounter: Principal | ICD-10-CM

## 2016-02-01 DIAGNOSIS — K716 Toxic liver disease with hepatitis, not elsewhere classified: Secondary | ICD-10-CM

## 2016-02-01 LAB — HEPATIC FUNCTION PANEL
ALT: 32 U/L (ref 9–46)
AST: 31 U/L (ref 10–40)
Albumin: 3.7 g/dL (ref 3.6–5.1)
Alkaline Phosphatase: 130 U/L — ABNORMAL HIGH (ref 40–115)
Bilirubin, Direct: 2.5 mg/dL — ABNORMAL HIGH (ref <=0.2)
Indirect Bilirubin: 2.8 mg/dL — ABNORMAL HIGH (ref 0.2–1.2)
Total Bilirubin: 5.3 mg/dL — ABNORMAL HIGH (ref 0.2–1.2)
Total Protein: 6.7 g/dL (ref 6.1–8.1)

## 2016-02-01 LAB — LITHIUM LEVEL: Lithium Lvl: 0.3 mEq/L — ABNORMAL LOW (ref 0.80–1.40)

## 2016-02-01 NOTE — Patient Instructions (Signed)
Please return Monday February 13, 2016 at 8 am

## 2016-02-01 NOTE — Progress Notes (Addendum)
By signing my name below, I, Stann Ore, attest that this documentation has been prepared under the direction and in the presence of Elvina Sidle, MD. Electronically Signed: Stann Ore, Scribe. 02/01/2016 , 8:07 AM .  Patient was seen in room 11 .   Patient ID: [name] MRN: [id number], DOB: 16-May-1986, 30 y.o. Date of Encounter: 02/01/2016  Primary Physician: Elvina Sidle, MD  Chief Complaint: No chief complaint on file.   HPI:  [name] is a 30 y.o. male who presents to [facility] complaining of fatigue, itching, intermittent tremor following acetaminophen O.D. Seeing Federico Flake, PhD [phone] Overall mood is much better.  Still icteric.  Past Medical History  Diagnosis Date  . 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 Authorizing Provider  FLUoxetine (PROZAC) 20 MG tablet Take 1 tablet (20 mg total) by mouth daily. 12/06/15   Elvina Sidle, MD  lithium carbonate (LITHOBID) 300 MG CR tablet Take 1 tablet (300 mg total) by mouth 2 (two) times daily. 01/25/16   Elvina Sidle, MD    Allergies: No Known Allergies  Social History   Social History  . Marital Status: Married    Spouse Name: N/A  . Number of Children: N/A  . Years of Education: N/A   Occupational History  . Not on file.   Social History Main Topics  . Smoking status: Former Smoker    Types: Cigarettes    Quit date: 07/24/2012  . Smokeless tobacco: Never Used  . Alcohol Use: No  . Drug Use: No  . Sexual Activity: Yes   Other Topics Concern  . Not on file   Social History Narrative     Review of Systems: Constitutional: negative for fever, chills, night sweats, weight changes, or fatigue  HEENT: negative for vision changes, hearing loss, congestion, rhinorrhea, ST, epistaxis, or sinus  pressure Cardiovascular: negative for chest pain or palpitations Respiratory: negative for hemoptysis, wheezing, shortness of breath, or cough Abdominal: negative for abdominal pain, nausea, vomiting, diarrhea, or constipation Dermatological: negative for rash Neurologic: negative for headache, dizziness, or syncope All other systems reviewed and are otherwise negative with the exception to those above and in the HPI.  Physical Exam: There were no vitals taken for this visit., There is no weight on file to calculate BMI. General: Well developed, well nourished, in no acute distress. Head: Normocephalic, atraumatic, eyes without discharge, sclera  Very icteric, nares are without discharge. Bilateral auditory canals clear, TM's are without perforation, pearly grey and translucent with reflective cone of light bilaterally. Oral cavity moist, posterior pharynx without exudate, erythema, peritonsillar abscess, or post nasal drip.  Neck: Supple. No thyromegaly. Full ROM. No lymphadenopathy. Lungs: Clear bilaterally to auscultation without wheezes, rales, or rhonchi. Breathing is unlabored. Heart: RRR with S1 S2. No murmurs, rubs, or gallops appreciated. Abdomen: Soft, non-tender, non-distended with normoactive bowel sounds. No hepatomegaly. No rebound/guarding. No obvious abdominal masses. Msk:  Strength and tone normal for age. Extremities/Skin: Warm and dry. No clubbing or cyanosis. No edema. No rashes or suspicious lesions. Neuro: Alert and oriented X 3. Moves all extremities spontaneously. Gait is normal. CNII-XII grossly in tact.  Fine motor tremor Psych:  Responds to questions appropriately with a normal affect.   Labs: pending  ASSESSMENT AND PLAN:  30 y.o. year old male with  This  chart was scribed in my presence and reviewed by me personally.    ICD-9-CM ICD-10-CM   1. Drug-induced hepatitis 573.3 K75.9 Hepatic function panel   E980.5  Lithium level  2. Overdose by acetaminophen,  intentional self-harm, subsequent encounter V58.89 T39.1X2D    965.4        Signed, Elvina Sidle, MD 02/01/2016 8:07 AM

## 2016-02-07 ENCOUNTER — Telehealth: Payer: Self-pay

## 2016-02-07 NOTE — Telephone Encounter (Signed)
Alice at [facility] is calling to request patient's lab work for Dr. Jola Baptist. Please fax! She states she need it today Fax: [phone]

## 2016-02-08 NOTE — Telephone Encounter (Signed)
Completed on 02/08/16

## 2016-02-13 ENCOUNTER — Ambulatory Visit (INDEPENDENT_AMBULATORY_CARE_PROVIDER_SITE_OTHER): Payer: BLUE CROSS/BLUE SHIELD | Admitting: Family Medicine

## 2016-02-13 VITALS — BP 124/72 | HR 77 | Temp 98.2°F | Resp 20 | Ht 65.0 in | Wt 157.4 lb

## 2016-02-13 DIAGNOSIS — F32A Depression, unspecified: Secondary | ICD-10-CM

## 2016-02-13 DIAGNOSIS — F329 Major depressive disorder, single episode, unspecified: Secondary | ICD-10-CM | POA: Diagnosis not present

## 2016-02-13 DIAGNOSIS — T391X2D Poisoning by 4-Aminophenol derivatives, intentional self-harm, subsequent encounter: Secondary | ICD-10-CM

## 2016-02-13 MED ORDER — LITHIUM CARBONATE ER 300 MG PO TBCR: 300.0000 mg | EXTENDED_RELEASE_TABLET | Freq: Every morning | ORAL | Status: DC

## 2016-02-13 MED ORDER — FLUOXETINE HCL 20 MG PO TABS: 20.0000 mg | ORAL_TABLET | Freq: Every day | ORAL | Status: DC

## 2016-02-13 NOTE — Progress Notes (Signed)
   Subjective:    Patient ID: [name], male    DOB: 04/07/1986, 30 y.o.   MRN: [id number] By signing my name below, I, Littie Deedsichard Sun, attest that this documentation has been prepared under the direction and in the presence of Elvina SidleKurt Inocencio Roy, MD.  Electronically Signed: Littie Deedsichard Sun, Medical Scribe. 02/13/2016. 8:26 AM.  HPI HPI Comments: [name] is a 30 y.o. male who presents to the [facility] for a follow-up for acetaminophen overdose that occurred on 1/15. He has been walking and has been feeling great overall physically and mentally. There had been some issues with his sister-in-law previously, but the family is getting along well now. He is back together with his wife. Patient had an appointment with his psychiatrist 6 days ago and states that the psychiatrist was supposed to contact me.    Review of Systems  Skin: Negative for color change.       Objective:   Physical Exam CONSTITUTIONAL: Well developed/well nourished HEAD: Normocephalic/atraumatic EYES: EOM/PERRL; no icterus ENMT: Mucous membranes moist NECK: supple no meningeal signs SPINE: entire spine nontender CV: S1/S2 noted, no murmurs/rubs/gallops noted. Regular rate and rhythm. LUNGS: Lungs are clear to auscultation bilaterally, no apparent distress ABDOMEN: soft, nontender, no rebound or guarding. Liver normal. No icterus. GU: no cva tenderness NEURO: Pt is awake/alert, moves all extremitiesx4 EXTREMITIES: pulses normal, full ROM SKIN: warm, color normal PSYCH: no abnormalities of mood noted  This chart was scribed in my presence and reviewed by me personally.    ICD-9-CM ICD-10-CM   1. Depression 311 F32.9 FLUoxetine (PROZAC) 20 MG tablet  2. Tylenol overdose, intentional self-harm, subsequent encounter V58.89 T39.1X2D lithium carbonate (LITHOBID) 300 MG CR tablet   965.4       Signed, Elvina SidleKurt Jarman Litton, MD

## 2016-02-13 NOTE — Patient Instructions (Signed)
Favor de Consulting civil engineerregressar el 5 de Avril  Tome solamente una pastilla de lithium por la manana  Continua el Prozac

## 2016-03-23 ENCOUNTER — Telehealth: Payer: Self-pay

## 2016-03-23 DIAGNOSIS — F4323 Adjustment disorder with mixed anxiety and depressed mood: Secondary | ICD-10-CM | POA: Diagnosis not present

## 2016-03-23 NOTE — Telephone Encounter (Signed)
Dr Elbert EwingsL.    Please call Federico Flakehil Barrineau regarding putting this patient back on his medications.   [phone]

## 2016-03-30 DIAGNOSIS — F4323 Adjustment disorder with mixed anxiety and depressed mood: Secondary | ICD-10-CM | POA: Diagnosis not present

## 2016-04-04 ENCOUNTER — Other Ambulatory Visit: Payer: Self-pay | Admitting: Family Medicine

## 2016-04-04 DIAGNOSIS — F329 Major depressive disorder, single episode, unspecified: Secondary | ICD-10-CM

## 2016-04-04 DIAGNOSIS — F32A Depression, unspecified: Secondary | ICD-10-CM

## 2016-04-04 MED ORDER — FLUOXETINE HCL 20 MG PO TABS: 20.0000 mg | ORAL_TABLET | Freq: Every day | ORAL | Status: DC

## 2016-04-04 NOTE — Progress Notes (Unsigned)
Please let patient know I refilled his antidepressant

## 2016-04-05 NOTE — Progress Notes (Signed)
Spoke with pt and he was advised

## 2016-04-06 DIAGNOSIS — F4323 Adjustment disorder with mixed anxiety and depressed mood: Secondary | ICD-10-CM | POA: Diagnosis not present

## 2016-04-20 DIAGNOSIS — F4323 Adjustment disorder with mixed anxiety and depressed mood: Secondary | ICD-10-CM | POA: Diagnosis not present

## 2016-05-12 ENCOUNTER — Emergency Department (HOSPITAL_COMMUNITY)
Admission: EM | Admit: 2016-05-12 | Discharge: 2016-05-13 | Disposition: A | Discharge status: Home / Self Care | Payer: BLUE CROSS/BLUE SHIELD | Attending: Emergency Medicine | Admitting: Emergency Medicine

## 2016-05-12 ENCOUNTER — Encounter (HOSPITAL_COMMUNITY): Payer: Self-pay | Admitting: Emergency Medicine

## 2016-05-12 ENCOUNTER — Emergency Department (HOSPITAL_COMMUNITY): Payer: BLUE CROSS/BLUE SHIELD

## 2016-05-12 DIAGNOSIS — Y999 Unspecified external cause status: Secondary | ICD-10-CM | POA: Diagnosis not present

## 2016-05-12 DIAGNOSIS — E876 Hypokalemia: Secondary | ICD-10-CM | POA: Insufficient documentation

## 2016-05-12 DIAGNOSIS — R51 Headache: Secondary | ICD-10-CM | POA: Insufficient documentation

## 2016-05-12 DIAGNOSIS — M546 Pain in thoracic spine: Secondary | ICD-10-CM | POA: Insufficient documentation

## 2016-05-12 DIAGNOSIS — S3991XA Unspecified injury of abdomen, initial encounter: Secondary | ICD-10-CM | POA: Diagnosis not present

## 2016-05-12 DIAGNOSIS — S0083XA Contusion of other part of head, initial encounter: Secondary | ICD-10-CM | POA: Insufficient documentation

## 2016-05-12 DIAGNOSIS — F1494 Cocaine use, unspecified with cocaine-induced mood disorder: Secondary | ICD-10-CM

## 2016-05-12 DIAGNOSIS — F1414 Cocaine abuse with cocaine-induced mood disorder: Secondary | ICD-10-CM | POA: Diagnosis not present

## 2016-05-12 DIAGNOSIS — Y939 Activity, unspecified: Secondary | ICD-10-CM | POA: Insufficient documentation

## 2016-05-12 DIAGNOSIS — F1010 Alcohol abuse, uncomplicated: Secondary | ICD-10-CM | POA: Diagnosis present

## 2016-05-12 DIAGNOSIS — Z87891 Personal history of nicotine dependence: Secondary | ICD-10-CM | POA: Insufficient documentation

## 2016-05-12 DIAGNOSIS — S0990XA Unspecified injury of head, initial encounter: Secondary | ICD-10-CM | POA: Diagnosis not present

## 2016-05-12 DIAGNOSIS — R10813 Right lower quadrant abdominal tenderness: Secondary | ICD-10-CM | POA: Diagnosis not present

## 2016-05-12 DIAGNOSIS — Y92410 Unspecified street and highway as the place of occurrence of the external cause: Secondary | ICD-10-CM | POA: Diagnosis not present

## 2016-05-12 DIAGNOSIS — R109 Unspecified abdominal pain: Secondary | ICD-10-CM | POA: Diagnosis not present

## 2016-05-12 DIAGNOSIS — F1410 Cocaine abuse, uncomplicated: Secondary | ICD-10-CM

## 2016-05-12 LAB — RAPID URINE DRUG SCREEN, HOSP PERFORMED
Amphetamines: NOT DETECTED
Barbiturates: NOT DETECTED
Benzodiazepines: NOT DETECTED
Cocaine: POSITIVE — AB
Opiates: NOT DETECTED
Tetrahydrocannabinol: NOT DETECTED

## 2016-05-12 LAB — COMPREHENSIVE METABOLIC PANEL
ALT: 24 U/L (ref 17–63)
AST: 27 U/L (ref 15–41)
Albumin: 4.6 g/dL (ref 3.5–5.0)
Alkaline Phosphatase: 57 U/L (ref 38–126)
Anion gap: 10 (ref 5–15)
BUN: <5 mg/dL — ABNORMAL LOW (ref 6–20)
CO2: 24 mmol/L (ref 22–32)
Calcium: 8.8 mg/dL — ABNORMAL LOW (ref 8.9–10.3)
Chloride: 108 mmol/L (ref 101–111)
Creatinine, Ser: 0.72 mg/dL (ref 0.61–1.24)
GFR calc Af Amer: >60 mL/min (ref >60)
GFR calc non Af Amer: >60 mL/min (ref >60)
Glucose, Bld: 106 mg/dL — ABNORMAL HIGH (ref 65–99)
Potassium: 3.1 mmol/L — ABNORMAL LOW (ref 3.5–5.1)
Sodium: 142 mmol/L (ref 135–145)
Total Bilirubin: 0.5 mg/dL (ref 0.3–1.2)
Total Protein: 7.8 g/dL (ref 6.5–8.1)

## 2016-05-12 LAB — CBC
HCT: 46.2 % (ref 39.0–52.0)
Hemoglobin: 15.9 g/dL (ref 13.0–17.0)
MCH: 28.5 pg (ref 26.0–34.0)
MCHC: 34.4 g/dL (ref 30.0–36.0)
MCV: 82.9 fL (ref 78.0–100.0)
Platelets: 292 10*3/uL (ref 150–400)
RBC: 5.57 MIL/uL (ref 4.22–5.81)
RDW: 13.4 % (ref 11.5–15.5)
WBC: 12.1 10*3/uL — ABNORMAL HIGH (ref 4.0–10.5)

## 2016-05-12 LAB — SALICYLATE LEVEL: Salicylate Lvl: <4 mg/dL (ref 2.8–30.0)

## 2016-05-12 LAB — ETHANOL: Alcohol, Ethyl (B): 144 mg/dL — ABNORMAL HIGH (ref <5)

## 2016-05-12 LAB — ACETAMINOPHEN LEVEL: Acetaminophen (Tylenol), Serum: <10 ug/mL — ABNORMAL LOW (ref 10–30)

## 2016-05-12 MED ORDER — LITHIUM CARBONATE ER 300 MG PO TBCR
300.0000 mg | EXTENDED_RELEASE_TABLET | Freq: Every morning | ORAL | Status: DC
  Administered 2016-05-13: 300 mg via ORAL
  Filled 2016-05-12: qty 1

## 2016-05-12 MED ORDER — FLUOXETINE HCL 20 MG PO TABS
20.0000 mg | ORAL_TABLET | Freq: Every day | ORAL | Status: DC
  Administered 2016-05-12 – 2016-05-13 (×2): 20 mg via ORAL
  Filled 2016-05-12 (×3): qty 1

## 2016-05-12 MED ORDER — IOPAMIDOL (ISOVUE-300) INJECTION 61%
100.0000 mL | Freq: Once | INTRAVENOUS | Status: AC | PRN
  Administered 2016-05-12: 100 mL via INTRAVENOUS

## 2016-05-12 NOTE — ED Notes (Signed)
Pt belonging bag given to [facility] staff with pt transfer. 
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
## 2016-05-12 NOTE — ED Notes (Signed)
Pt refused to have blood drawn at this time.

## 2016-05-12 NOTE — B[facility] Counselor (Signed)
Per Nanine MeansJamison Lord, Pt. To remain overnight, Re-evaluate in the a.m. Tiffanni Scarfo K. Sherlon HandingHarris, LCAS-A, LPC-A, [facility]  Counselor 05/12/2016 5:32 PM

## 2016-05-12 NOTE — ED Notes (Signed)
Pt said that he does not want to kill himself or hurt anyone else. He said his actions were because of alcohol. He is calm and cooperative, took his medication, and asked to use the telephone. A&O x3, no distress noted, no complaints voiced. Given a drink and snacks.

## 2016-05-12 NOTE — ED Notes (Signed)
Patient noted sleeping in room. No complaints, stable, in no acute distress. Q15 minute rounds and monitoring via Security Cameras to continue.  
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
## 2016-05-12 NOTE — ED Notes (Signed)
Report received from Diane RN. Patient alert and oriented, warm and dry, in no acute distress. Patient denies SI, HI, AVH and pain. Patient made aware of Q15 minute rounds and security cameras for their safety. Patient instructed to come to me with needs or concerns. 

## 2016-05-12 NOTE — ED Notes (Signed)
Pt belongings: Wallet, lighter, medicine bottle, black shirt Black vans, black jeans
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
## 2016-05-13 DIAGNOSIS — F1010 Alcohol abuse, uncomplicated: Secondary | ICD-10-CM

## 2016-05-13 DIAGNOSIS — F1494 Cocaine use, unspecified with cocaine-induced mood disorder: Secondary | ICD-10-CM

## 2016-05-13 DIAGNOSIS — F1410 Cocaine abuse, uncomplicated: Secondary | ICD-10-CM | POA: Diagnosis present

## 2016-05-13 NOTE — ED Notes (Signed)
Patient noted sleeping in room. No complaints, stable, in no acute distress. Q15 minute rounds and monitoring via Security Cameras to continue.  
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
## 2016-05-13 NOTE — Consult Note (Signed)
[place] Psychiatry Consult   Reason for Consult:  Suicidal ideations Referring Physician:  EDP Patient Identification: [name] MRN:  [id number] Principal Diagnosis: Cocaine-induced mood disorder [facility]) Diagnosis:   Patient Active Problem List   Diagnosis Date Noted  . Alcohol abuse [F10.10] 05/13/2016    Priority: High  . Cocaine abuse [F14.10] 05/13/2016    Priority: High  . Cocaine-induced mood disorder ([place]) [F14.94] 05/13/2016    Priority: High  . Overdose by acetaminophen [T39.1X4A] 02/01/2016  . 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 male patient does not warrant admission.  HPI:  30 yo male who presented to the ED after making a statement about suicidal ideations when he was arrested for a DWI for drinking and abusing cocaine.  When he got to the ED, he denies suicidal/homicidal ideations, saying he was upset at the time.  Denies hallucinations.  He denies drug and alcohol issues, stating he was drinking yesterday and used cocaine a friend offered him, never used before and does not plan to in the future.  He sees Dr. Abbe Amsterdam in outpatient and is prescribed Prozac 20 mg which he states he takes and has been effective.  Stable for discharge.  Past Psychiatric History: depression  Risk to Self: Suicidal Ideation: No Suicidal Intent: No Is patient at risk for suicide?: Yes Suicidal Plan?: No Access to Means: Yes Specify Access to Suicidal Means: access to medication What has been your use of drugs/alcohol within the last 12 months?: pt. denies other than recent alcohol How many times?: 1 Other Self Harm Risks: none noted Triggers for Past Attempts: Unknown Intentional Self Injurious Behavior: None Risk to Others: Homicidal Ideation: No-Not Currently/Within Last 6 Months Thoughts of Harm to Others: No-Not Currently Present/Within Last 6 Months Current Homicidal  Intent: No Current Homicidal Plan: No Access to Homicidal Means: No Identified Victim: na History of harm to others?: No Assessment of Violence: None Noted Violent Behavior Description: none noted Does patient have access to weapons?: No Criminal Charges Pending?: Yes Describe Pending Criminal Charges: most recent dwi Does patient have a court date: No Prior Inpatient Therapy: Prior Inpatient Therapy: Yes Prior Therapy Dates: 12/2014 Prior Therapy Facilty/Provider(s): [facility] Reason for Treatment: O.D. Prior Outpatient Therapy: Prior Outpatient Therapy: Yes Prior Therapy Dates: current Prior Therapy Facilty/Provider(s): Dr. Abbe Amsterdam off of [address] Reason for Treatment: unspecified Does patient have an ACCT team?: No Does patient have Intensive In-House Services?  : No Does patient have [facility] services? : No Does patient have P4CC services?: No  Past Medical History:  Past Medical History  Diagnosis Date  . 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 No pertinent past surgical history. Family History:  Family History  Problem Relation Age of Onset  . Hypertension Mother    Family Psychiatric  History: none Social History:  History  Alcohol Use  . Yes    Comment: occassional     History  Drug Use No    Social History   Social History  . Marital Status: Married    Spouse Name: N/A  . Number of Children: N/A  . Years of Education: N/A   Social History Main Topics  . Smoking status: Former Smoker -- 0.25 packs/day    Types: Cigarettes    Quit date: 07/24/2012  . Smokeless tobacco: Never Used  .  Alcohol Use: Yes     Comment: occassional  . Drug Use: No  . Sexual Activity: Yes   Other Topics Concern  . None   Social History Narrative   Additional Social History:    Allergies:  No Known Allergies  Labs:  Results for orders placed or performed during the hospital encounter of  05/12/16 (from the past 48 hour(s))  Comprehensive metabolic panel     Status: Abnormal   Collection Time: 05/12/16  1:00 PM  Result Value Ref Range   Sodium 142 135 - 145 mmol/L   Potassium 3.1 (L) 3.5 - 5.1 mmol/L   Chloride 108 101 - 111 mmol/L   CO2 24 22 - 32 mmol/L   Glucose, Bld 106 (H) 65 - 99 mg/dL   BUN <5 (L) 6 - 20 mg/dL   Creatinine, Ser 0.72 0.61 - 1.24 mg/dL   Calcium 8.8 (L) 8.9 - 10.3 mg/dL   Total Protein 7.8 6.5 - 8.1 g/dL   Albumin 4.6 3.5 - 5.0 g/dL   AST 27 15 - 41 U/L   ALT 24 17 - 63 U/L   Alkaline Phosphatase 57 38 - 126 U/L   Total Bilirubin 0.5 0.3 - 1.2 mg/dL   GFR calc non Af Amer >60 >60 mL/min   GFR calc Af Amer >60 >60 mL/min    Comment: (NOTE) The eGFR has been calculated using the CKD EPI equation. This calculation has not been validated in all clinical situations. eGFR's persistently <60 mL/min signify possible Chronic Kidney Disease.    Anion gap 10 5 - 15  Ethanol     Status: Abnormal   Collection Time: 05/12/16  1:00 PM  Result Value Ref Range   Alcohol, Ethyl (B) 144 (H) <5 mg/dL    Comment:        LOWEST DETECTABLE LIMIT FOR SERUM ALCOHOL IS 5 mg/dL FOR MEDICAL PURPOSES ONLY   Salicylate level     Status: None   Collection Time: 05/12/16  1:00 PM  Result Value Ref Range   Salicylate Lvl <6.6 2.8 - 30.0 mg/dL  Acetaminophen level     Status: Abnormal   Collection Time: 05/12/16  1:00 PM  Result Value Ref Range   Acetaminophen (Tylenol), Serum <10 (L) 10 - 30 ug/mL    Comment:        THERAPEUTIC CONCENTRATIONS VARY SIGNIFICANTLY. A RANGE OF 10-30 ug/mL MAY BE AN EFFECTIVE CONCENTRATION FOR MANY PATIENTS. HOWEVER, SOME ARE BEST TREATED AT CONCENTRATIONS OUTSIDE THIS RANGE. ACETAMINOPHEN CONCENTRATIONS >150 ug/mL AT 4 HOURS AFTER INGESTION AND >50 ug/mL AT 12 HOURS AFTER INGESTION ARE OFTEN ASSOCIATED WITH TOXIC REACTIONS.   cbc     Status: Abnormal   Collection Time: 05/12/16  1:00 PM  Result Value Ref Range   WBC 12.1  (H) 4.0 - 10.5 K/uL   RBC 5.57 4.22 - 5.81 MIL/uL   Hemoglobin 15.9 13.0 - 17.0 g/dL   HCT 46.2 39.0 - 52.0 %   MCV 82.9 78.0 - 100.0 fL   MCH 28.5 26.0 - 34.0 pg   MCHC 34.4 30.0 - 36.0 g/dL   RDW 13.4 11.5 - 15.5 %   Platelets 292 150 - 400 K/uL  Rapid urine drug screen (hospital performed)     Status: Abnormal   Collection Time: 05/12/16  2:19 PM  Result Value Ref Range   Opiates NONE DETECTED NONE DETECTED   Cocaine POSITIVE (A) NONE DETECTED   Benzodiazepines NONE DETECTED NONE DETECTED   Amphetamines NONE DETECTED  NONE DETECTED   Tetrahydrocannabinol NONE DETECTED NONE DETECTED   Barbiturates NONE DETECTED NONE DETECTED    Comment:        DRUG SCREEN FOR MEDICAL PURPOSES ONLY.  IF CONFIRMATION IS NEEDED FOR ANY PURPOSE, NOTIFY LAB WITHIN 5 DAYS.        LOWEST DETECTABLE LIMITS FOR URINE DRUG SCREEN Drug Class       Cutoff (ng/mL) Amphetamine      1000 Barbiturate      200 Benzodiazepine   202 Tricyclics       334 Opiates          300 Cocaine          300 THC              50     Current Facility-Administered Medications  Medication Dose Route Frequency Provider Last Rate Last Dose  . FLUoxetine (PROZAC) tablet 20 mg  20 mg Oral Daily Nederland Lions, PA-C   20 mg at 05/13/16 3568  . lithium carbonate (LITHOBID) CR tablet 300 mg  300 mg Oral q morning - 10a Osino Lions, PA-C   300 mg at 05/13/16 6168   Current Outpatient Prescriptions  Medication Sig Dispense Refill  . FLUoxetine (PROZAC) 20 MG tablet Take 1 tablet (20 mg total) by mouth daily. 30 tablet 3  . lithium carbonate (LITHOBID) 300 MG CR tablet Take 1 tablet (300 mg total) by mouth every morning. (Patient not taking: Reported on 05/12/2016) 60 tablet 1    Musculoskeletal: Strength & Muscle Tone: within normal limits Gait & Station: normal Patient leans: N/A  Psychiatric Specialty Exam: Physical Exam  Constitutional: He is oriented to person, place, and time. He appears well-developed  and well-nourished.  HENT:  Head: Normocephalic.  Neck: Normal range of motion.  Respiratory: Effort normal.  GI: Soft.  Musculoskeletal: Normal range of motion.  Neurological: He is alert and oriented to person, place, and time.  Skin: Skin is warm and dry.  Psychiatric: His speech is normal and behavior is normal. Judgment and thought content normal. Cognition and memory are normal. He exhibits a depressed mood.    Review of Systems  Constitutional: Negative.   HENT: Negative.   Eyes: Negative.   Respiratory: Negative.   Cardiovascular: Negative.   Gastrointestinal: Negative.   Genitourinary: Negative.   Musculoskeletal: Negative.   Skin: Negative.   Neurological: Negative.   Endo/Heme/Allergies: Negative.   Psychiatric/Behavioral: Positive for depression and substance abuse.    Blood pressure 121/79, pulse 73, temperature 98.5 F (36.9 C), temperature source Oral, resp. rate 16, SpO2 99 %.There is no weight on file to calculate BMI.  General Appearance: Casual  Eye Contact:  Good  Speech:  Normal Rate  Volume:  Normal  Mood:  Depressed, mild  Affect:  Appropriate  Thought Process:  Coherent  Orientation:  Full (Time, Place, and Person)  Thought Content:  WDL  Suicidal Thoughts:  No  Homicidal Thoughts:  No  Memory:  Immediate;   Good Recent;   Good Remote;   Good  Judgement:  Fair  Insight:  Fair  Psychomotor Activity:  Normal  Concentration:  Concentration: Good and Attention Span: Good  Recall:  Good  Fund of Knowledge:  Good  Language:  Good  Akathisia:  No  Handed:  Right  AIMS (if indicated):     Assets:  Housing Leisure Time Physical Health Resilience Social Support  ADL's:  Intact  Cognition:  WNL  Sleep:  Treatment Plan Summary: Daily contact with patient to assess and evaluate symptoms and progress in treatment, Medication management and Plan cocaine induced mood disorder:  -Crisis stabilization -Medication management:  Restart his  Prozac 20 mg daily for depression and Lithium 300 mg every am for mood stabilization -Individual and substance abuse counseling  Disposition: No evidence of imminent risk to self or others at present.    Waylan Boga, NP 05/13/2016 10:37 AM Patient seen face-to-face for psychiatric evaluation, chart reviewed and case discussed with the physician extender and developed treatment plan. Reviewed the information documented and agree with the treatment plan. Corena Pilgrim, MD

## 2016-05-13 NOTE — [facility] Suicide Risk Assessment (Signed)
Suicide Risk Assessment  Discharge Assessment   [facility]   Principal Problem: Cocaine-induced mood disorder [facility](HCC) Discharge Diagnoses:  Patient Active Problem List   Diagnosis Date Noted  . Alcohol abuse [F10.10] 05/13/2016    Priority: High  . Cocaine abuse [F14.10] 05/13/2016    Priority: High  . Cocaine-induced mood disorder ([facility]) [F14.94] 05/13/2016    Priority: High  . Overdose by acetaminophen [T39.1X4A] 02/01/2016  . 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 He appears well-developed and well-nourished.  HENT:  Head: Normocephalic.  Neck: Normal range of motion.  Respiratory: Effort normal.  GI: Soft.  Musculoskeletal: Normal range of motion.  Neurological: He is alert and oriented to person, place, and time.  Skin: Skin is warm and dry.  Psychiatric: His speech is normal and behavior is normal. Judgment and thought content normal. Cognition and memory are normal. He exhibits a depressed mood.    Review of Systems  Constitutional: Negative.   HENT: Negative.   Eyes: Negative.   Respiratory: Negative.   Cardiovascular: Negative.   Gastrointestinal: Negative.   Genitourinary: Negative.   Musculoskeletal: Negative.   Skin: Negative.   Neurological: Negative.   Endo/Heme/Allergies: Negative.   Psychiatric/Behavioral: Positive for depression and substance abuse.    Blood pressure 121/79, pulse 73, temperature 98.5 F (36.9 C), temperature source Oral, resp. rate 16, SpO2 99 %.There is no weight on file to calculate BMI.  General Appearance: Casual  Eye Contact:  Good  Speech:  Normal Rate  Volume:  Normal  Mood:  Depressed, mild  Affect:  Appropriate  Thought Process:  Coherent   Orientation:  Full (Time, Place, and Person)  Thought Content:  WDL  Suicidal Thoughts:  No  Homicidal Thoughts:  No  Memory:  Immediate;   Good Recent;   Good Remote;   Good  Judgement:  Fair  Insight:  Fair  Psychomotor Activity:  Normal  Concentration:  Concentration: Good and Attention Span: Good  Recall:  Good  Fund of Knowledge:  Good  Language:  Good  Akathisia:  No  Handed:  Right  AIMS (if indicated):     Assets:  Housing Leisure Time Physical Health Resilience Social Support  ADL's:  Intact  Cognition:  WNL  Sleep:      Mental Status Per Nursing Assessment::   On Admission:   alcohol and cocaine abuse with suicide statement  Demographic Factors:  Male and Adolescent or young adult  Loss Factors: Legal issues  Historical Factors: Impulsivity  Risk Reduction Factors:   Responsible for children under 30 years of age, Sense of responsibility to family, Employed, Living with another person, especially a relative, Positive social support and Positive therapeutic relationship  Continued Clinical Symptoms:  Depression, mild  Cognitive Features That Contribute To Risk:  None    Suicide Risk:  Minimal: No identifiable suicidal ideation.  Patients presenting with no risk factors but with morbid ruminations; may be classified as minimal risk based on the severity of the depressive symptoms    Plan Of Care/Follow-up recommendations:  Activity:  as tolerated Diet:  heart healthy diet  LORD, JAMISON, NP 05/13/2016, 10:49 AM

## 2016-05-14 DIAGNOSIS — F4323 Adjustment disorder with mixed anxiety and depressed mood: Secondary | ICD-10-CM | POA: Diagnosis not present

## 2016-05-25 DIAGNOSIS — F4323 Adjustment disorder with mixed anxiety and depressed mood: Secondary | ICD-10-CM | POA: Diagnosis not present

## 2016-05-30 ENCOUNTER — Emergency Department (HOSPITAL_COMMUNITY): Payer: BLUE CROSS/BLUE SHIELD

## 2016-05-30 ENCOUNTER — Encounter (HOSPITAL_COMMUNITY): Payer: Self-pay | Admitting: *Deleted

## 2016-05-30 ENCOUNTER — Emergency Department (HOSPITAL_COMMUNITY)
Admission: EM | Admit: 2016-05-30 | Discharge: 2016-05-30 | Disposition: A | Discharge status: Other Acute Inpatient Hospital | Payer: BLUE CROSS/BLUE SHIELD | Attending: Emergency Medicine | Admitting: Emergency Medicine

## 2016-05-30 ENCOUNTER — Other Ambulatory Visit: Payer: Self-pay

## 2016-05-30 ENCOUNTER — Inpatient Hospital Stay (HOSPITAL_COMMUNITY)
Admission: AD | Admit: 2016-05-30 | Discharge: 2016-06-03 | DRG: 885 | Disposition: A | Discharge status: Home / Self Care | Payer: BLUE CROSS/BLUE SHIELD | Source: Intra-hospital | Attending: Psychiatry | Admitting: Psychiatry

## 2016-05-30 DIAGNOSIS — Z87891 Personal history of nicotine dependence: Secondary | ICD-10-CM | POA: Insufficient documentation

## 2016-05-30 DIAGNOSIS — Y9289 Other specified places as the place of occurrence of the external cause: Secondary | ICD-10-CM | POA: Insufficient documentation

## 2016-05-30 DIAGNOSIS — R079 Chest pain, unspecified: Secondary | ICD-10-CM | POA: Insufficient documentation

## 2016-05-30 DIAGNOSIS — T1491XA Suicide attempt, initial encounter: Secondary | ICD-10-CM

## 2016-05-30 DIAGNOSIS — Y939 Activity, unspecified: Secondary | ICD-10-CM | POA: Diagnosis not present

## 2016-05-30 DIAGNOSIS — Z79899 Other long term (current) drug therapy: Secondary | ICD-10-CM | POA: Insufficient documentation

## 2016-05-30 DIAGNOSIS — T520X2A Toxic effect of petroleum products, intentional self-harm, initial encounter: Secondary | ICD-10-CM

## 2016-05-30 DIAGNOSIS — T1491 Suicide attempt: Secondary | ICD-10-CM | POA: Diagnosis not present

## 2016-05-30 DIAGNOSIS — Z915 Personal history of self-harm: Secondary | ICD-10-CM | POA: Diagnosis not present

## 2016-05-30 DIAGNOSIS — F332 Major depressive disorder, recurrent severe without psychotic features: Principal | ICD-10-CM | POA: Diagnosis present

## 2016-05-30 DIAGNOSIS — F1410 Cocaine abuse, uncomplicated: Secondary | ICD-10-CM | POA: Diagnosis present

## 2016-05-30 DIAGNOSIS — Y999 Unspecified external cause status: Secondary | ICD-10-CM | POA: Diagnosis not present

## 2016-05-30 DIAGNOSIS — S299XXA Unspecified injury of thorax, initial encounter: Secondary | ICD-10-CM | POA: Diagnosis not present

## 2016-05-30 LAB — CBC WITH DIFFERENTIAL/PLATELET
Basophils Absolute: 0 10*3/uL (ref 0.0–0.1)
Basophils Relative: 0 %
Eosinophils Absolute: 0.1 10*3/uL (ref 0.0–0.7)
Eosinophils Relative: 1 %
HCT: 44.9 % (ref 39.0–52.0)
Hemoglobin: 15.5 g/dL (ref 13.0–17.0)
Lymphocytes Relative: 16 %
Lymphs Abs: 2.2 10*3/uL (ref 0.7–4.0)
MCH: 28.8 pg (ref 26.0–34.0)
MCHC: 34.5 g/dL (ref 30.0–36.0)
MCV: 83.3 fL (ref 78.0–100.0)
Monocytes Absolute: 1.7 10*3/uL — ABNORMAL HIGH (ref 0.1–1.0)
Monocytes Relative: 12 %
Neutro Abs: 10.1 10*3/uL — ABNORMAL HIGH (ref 1.7–7.7)
Neutrophils Relative %: 71 %
Platelets: 271 10*3/uL (ref 150–400)
RBC: 5.39 MIL/uL (ref 4.22–5.81)
RDW: 14 % (ref 11.5–15.5)
WBC: 14.2 10*3/uL — ABNORMAL HIGH (ref 4.0–10.5)

## 2016-05-30 LAB — COMPREHENSIVE METABOLIC PANEL
ALT: 27 U/L (ref 17–63)
AST: 29 U/L (ref 15–41)
Albumin: 4.3 g/dL (ref 3.5–5.0)
Alkaline Phosphatase: 65 U/L (ref 38–126)
Anion gap: 10 (ref 5–15)
BUN: 8 mg/dL (ref 6–20)
CO2: 25 mmol/L (ref 22–32)
Calcium: 9.3 mg/dL (ref 8.9–10.3)
Chloride: 103 mmol/L (ref 101–111)
Creatinine, Ser: 1.17 mg/dL (ref 0.61–1.24)
GFR calc Af Amer: >60 mL/min (ref >60)
GFR calc non Af Amer: >60 mL/min (ref >60)
Glucose, Bld: 109 mg/dL — ABNORMAL HIGH (ref 65–99)
Potassium: 3.2 mmol/L — ABNORMAL LOW (ref 3.5–5.1)
Sodium: 138 mmol/L (ref 135–145)
Total Bilirubin: 1.2 mg/dL (ref 0.3–1.2)
Total Protein: 8.1 g/dL (ref 6.5–8.1)

## 2016-05-30 LAB — ETHANOL: Alcohol, Ethyl (B): <5 mg/dL (ref <5)

## 2016-05-30 LAB — RAPID URINE DRUG SCREEN, HOSP PERFORMED
Amphetamines: NOT DETECTED
Barbiturates: NOT DETECTED
Benzodiazepines: NOT DETECTED
Cocaine: POSITIVE — AB
Opiates: NOT DETECTED
Tetrahydrocannabinol: NOT DETECTED

## 2016-05-30 LAB — ACETAMINOPHEN LEVEL: Acetaminophen (Tylenol), Serum: <10 ug/mL — ABNORMAL LOW (ref 10–30)

## 2016-05-30 LAB — LITHIUM LEVEL: Lithium Lvl: <0.06 mmol/L — ABNORMAL LOW (ref 0.60–1.20)

## 2016-05-30 LAB — SALICYLATE LEVEL: Salicylate Lvl: <4 mg/dL (ref 2.8–30.0)

## 2016-05-30 MED ORDER — ACETAMINOPHEN 325 MG PO TABS
650.0000 mg | ORAL_TABLET | Freq: Four times a day (QID) | ORAL | Status: DC | PRN
  Administered 2016-05-30 – 2016-06-02 (×9): 650 mg via ORAL
  Filled 2016-05-30 (×9): qty 2

## 2016-05-30 MED ORDER — ACETAMINOPHEN 325 MG PO TABS: 650.0000 mg | ORAL_TABLET | ORAL | Status: DC | PRN

## 2016-05-30 MED ORDER — FLUOXETINE HCL 20 MG PO TABS
20.0000 mg | ORAL_TABLET | Freq: Every day | ORAL | Status: DC
  Administered 2016-05-30: 20 mg via ORAL
  Filled 2016-05-30: qty 1

## 2016-05-30 MED ORDER — ACETAMINOPHEN 325 MG PO TABS
650.0000 mg | ORAL_TABLET | Freq: Once | ORAL | Status: AC
  Administered 2016-05-30: 650 mg via ORAL
  Filled 2016-05-30: qty 2

## 2016-05-30 MED ORDER — LORAZEPAM 1 MG PO TABS: 1.0000 mg | ORAL_TABLET | Freq: Three times a day (TID) | ORAL | Status: DC | PRN

## 2016-05-30 MED ORDER — FLUOXETINE HCL 20 MG PO TABS
20.0000 mg | ORAL_TABLET | Freq: Every day | ORAL | Status: DC
  Filled 2016-05-30: qty 1

## 2016-05-30 MED ORDER — IBUPROFEN 200 MG PO TABS
600.0000 mg | ORAL_TABLET | Freq: Three times a day (TID) | ORAL | Status: DC | PRN
  Administered 2016-05-30: 600 mg via ORAL
  Filled 2016-05-30: qty 3

## 2016-05-30 MED ORDER — IOPAMIDOL (ISOVUE-300) INJECTION 61%
75.0000 mL | Freq: Once | INTRAVENOUS | Status: AC | PRN
  Administered 2016-05-30: 75 mL via INTRAVENOUS

## 2016-05-30 MED ORDER — LAMOTRIGINE 25 MG PO TABS
50.0000 mg | ORAL_TABLET | Freq: Every evening | ORAL | Status: DC
  Administered 2016-05-30 – 2016-06-02 (×4): 50 mg via ORAL
  Filled 2016-05-30 (×7): qty 2

## 2016-05-30 MED ORDER — ONDANSETRON HCL 4 MG PO TABS: 4.0000 mg | ORAL_TABLET | Freq: Three times a day (TID) | ORAL | Status: DC | PRN

## 2016-05-30 MED ORDER — HYDROXYZINE HCL 25 MG PO TABS: 25.0000 mg | ORAL_TABLET | Freq: Four times a day (QID) | ORAL | Status: DC | PRN

## 2016-05-30 MED ORDER — TRAZODONE HCL 50 MG PO TABS
50.0000 mg | ORAL_TABLET | Freq: Every evening | ORAL | Status: DC | PRN
  Administered 2016-05-30 – 2016-06-02 (×4): 50 mg via ORAL
  Filled 2016-05-30 (×13): qty 1

## 2016-05-30 MED ORDER — ALUM & MAG HYDROXIDE-SIMETH 200-200-20 MG/5ML PO SUSP: 30.0000 mL | ORAL | Status: DC | PRN

## 2016-05-30 MED ORDER — FLUOXETINE HCL 20 MG PO CAPS
20.0000 mg | ORAL_CAPSULE | Freq: Every day | ORAL | Status: DC
  Administered 2016-05-31 – 2016-06-01 (×2): 20 mg via ORAL
  Filled 2016-05-30 (×5): qty 1

## 2016-05-30 MED ORDER — POTASSIUM CHLORIDE CRYS ER 20 MEQ PO TBCR
40.0000 meq | EXTENDED_RELEASE_TABLET | Freq: Once | ORAL | Status: AC
  Administered 2016-05-30: 40 meq via ORAL
  Filled 2016-05-30: qty 2

## 2016-05-30 MED ORDER — MAGNESIUM HYDROXIDE 400 MG/5ML PO SUSP: 30.0000 mL | Freq: Every day | ORAL | Status: DC | PRN

## 2016-05-30 NOTE — ED Notes (Signed)
Pt's family given a copy of the unit rules. Pt verbalized understanding as well.

## 2016-05-30 NOTE — ED Notes (Signed)
Pt wanded by security. 
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
## 2016-05-30 NOTE — ED Notes (Signed)
Pt given crackers and water for fluid challenge. Sitter at bedside.
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
## 2016-05-30 NOTE — ED Notes (Signed)
Pt awake, alert & responsive, no distress noted.  Pt talking on phone on present.  [facility] [place] [facility].  Monitoring for safety, Q 15 min checks in effect.

## 2016-05-30 NOTE — ED Notes (Signed)
TTS at bedside. 

## 2016-05-30 NOTE — ED Notes (Signed)
[facility] Expected date:  Expected time:  Means of arrival:  Comments: Allena KatzGuzman

## 2016-05-30 NOTE — ED Notes (Addendum)
Blue button up PublixShirt, Jeans, Wallet, Phone, Allied Waste IndustriesBlack socks, black shoes [employer] watch
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
## 2016-05-30 NOTE — ED Notes (Signed)
Patient transported to CT 

## 2016-05-30 NOTE — ED Notes (Signed)
Report called to [facility] 
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
## 2016-05-30 NOTE — ED Notes (Signed)
Report called to RN Sandra, [facility].  Pending [facility] transport. 

## 2016-05-30 NOTE — ED Provider Notes (Signed)
Reviewed records and patient has had an appropriate workup for his complaints and conditions and appears to be medically cleared at this point for further psychiatric workup and care.   Marily MemosJason Maveryck Bahri, MD 05/30/16 1758

## 2016-05-30 NOTE — Tx Team (Signed)
Initial Interdisciplinary Treatment Plan   PATIENT STRESSORS: Legal issue Marital or family conflict Substance abuse   PATIENT STRENGTHS: Active sense of humor Communication skills Physical Health Supportive family/friends   PROBLEM LIST: Problem List/Patient Goals Date to be addressed Date deferred Reason deferred Estimated date of resolution  At risk for suicide 05/30/2016   05/30/2016   D/C  Substance Abuse 05/30/2016  05/30/2016   D/C  Depression 05/30/2016  05/30/2016   D/C  "self-esteem" 05/30/2016  05/30/2016   D/C  "Love myself" 05/30/2016  05/30/2016   D/C                           DISCHARGE CRITERIA:  Improved stabilization in mood, thinking, and/or behavior Motivation to continue treatment in a less acute level of care Need for constant or close observation no longer present Reduction of life-threatening or endangering symptoms to within safe limits Withdrawal symptoms are absent or subacute and managed without 24-hour nursing intervention  PRELIMINARY DISCHARGE PLAN: Attend 12-step recovery group Outpatient therapy Participate in family therapy Return to previous living arrangement Return to previous work or school arrangements  PATIENT/FAMIILY INVOLVEMENT: This treatment plan has been presented to and reviewed with the patient, [name].  The patient and family have been given the opportunity to ask questions and make suggestions.  Larry SierrasMiddleton, Jerry Haugen P 05/30/2016, 10:13 PM

## 2016-05-31 DIAGNOSIS — F332 Major depressive disorder, recurrent severe without psychotic features: Principal | ICD-10-CM
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
## 2016-05-31 NOTE — Progress Notes (Signed)
Pt signed 72 hour request for discharge on 05/31/16 at 1600.  Liborio NixonPatrice Imane Burrough, RN., 05/31/16

## 2016-05-31 NOTE — Progress Notes (Signed)
Patient did attend the evening karaoke group. Pt was engaged, supportive, and participated by singing a song.  
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
## 2016-05-31 NOTE — [facility] Group Notes (Signed)
[facility] LCSW Group Therapy  05/31/2016   1:15 PM   Type of Therapy:  Group Therapy  Participation Level:  Active  Participation Quality:  Attentive, Sharing and Supportive  Affect:  Depressed and Flat  Cognitive:  Alert and Oriented  Insight:  Developing/Improving and Engaged  Engagement in Therapy:  Developing/Improving and Engaged  Modes of Intervention:  Clarification, Confrontation, Discussion, Education, Exploration, Limit-setting, Orientation, Problem-solving, Rapport Building, Dance movement psychotherapisteality Testing, Socialization and Support  Summary of Progress/Problems: The topic for group therapy was feelings about diagnosis.  Pt actively participated in group discussion on their past and current diagnosis and how they feel towards this.  Pt also identified how society and family members judge them, based on their diagnosis as well as stereotypes and stigmas.  Patient reports that he was admitted for using substances but denies having an addiction issue. He reports some conflict with ex-girlfriend and difficulty seeing his children. Patient focused on relationship with children's mother during discussion.   Samuella BruinKristin Samah Lapiana, MSW, LCSW Clinical Social Worker [facility] [phone]

## 2016-05-31 NOTE — Progress Notes (Signed)
Patient ID: Matthew [name] [name] [name], male   DOB: 01/28/1986, 30 y.o.   MRN: [id number] D: Client reports pain "8" of 10. Client reports having problems with ex-GF, "I need to think about things twice before I do them" client reports he laid in front of GF car, "see" client shows writer bruised to upper back. Client reports problems over his seeing the children. Client reports GF takes him in then out again, "don't know what to do" "I go see kids she say call, when I call she say she not at home, then I go when her sister there she get mad" A: Writer provides emotional support, reviews medications, administered as ordered. Staff will monitor q5715min for safety. R: client is safe on the unit.

## 2016-05-31 NOTE — Progress Notes (Signed)
Patient ID: [name], male   DOB: 01/09/1986, 30 y.o.   MRN: [id number] D: Client visible on the unit, reports he had a visit from mom and sister today. "nobody like mom" Client reports " I signed a 72 hour discharge, I came in voluntarily, so I hope to be going home by Monday" Client reports he will be staying with mom. Goal: "to participate in the activities and to feel better" client reports goal was meet as he attended all groups today. Client reports depression "1" of 10. Client smiling, animated. A: Writer encouraged client to consider children, who need their father, move towards mental well being,following plan of care and continue to use support system. Medications reviewed, administered as ordered. Staff will monitor q4515min for safety. R: Client is safe on the unit, attended karaoke and sang a song.

## 2016-05-31 NOTE — [facility] Suicide Risk Assessment (Signed)
[facility]   Nursing information obtained from:  Patient Demographic factors:  Male Current Mental Status:  Self-harm thoughts, Self-harm behaviors Loss Factors:  Loss of significant relationship, Legal issues Historical Factors:  Impulsivity Risk Reduction Factors:  Responsible for children under 30 years of age, Sense of responsibility to family, Religious beliefs about death, Employed, Living with another person, especially a relative, Positive social support  Total Time spent with patient: 45 minutes Principal Problem: <principal problem not specified> Diagnosis:   Patient Active Problem List   Diagnosis Date Noted  . MDD (major depressive disorder), recurrent episode, severe (HCC) [F33.2] 05/30/2016  . Alcohol abuse [F10.10] 05/13/2016  . Cocaine abuse [F14.10] 05/13/2016  . Cocaine-induced mood disorder (HCC) [F14.94] 05/13/2016  . Overdose by acetaminophen [T39.1X4A] 02/01/2016  . CYSTICERCOSIS [B69.9] 03/26/2009   Subjective Data: Patient is a 30 year old Hispanic origin male who attempted suicide after a breakup with his girlfriend recently.  Continued Clinical Symptoms:    The "Alcohol Use Disorders Identification Test", Guidelines for Use in Primary Care, Second Edition.  World Science writerHealth Organization [facility](WHO). Score between 0-7:  no or low risk or alcohol related problems. Score between 8-15:  moderate risk of alcohol related problems. Score between 16-19:  high risk of alcohol related problems. Score 20 or above:  warrants further diagnostic evaluation for alcohol dependence and treatment.   CLINICAL FACTORS:   Depression:   Anhedonia Hopelessness Impulsivity Insomnia Severe   Musculoskeletal: Strength & Muscle Tone: within normal limits Gait & Station: normal Patient leans: N/A  Psychiatric Specialty Exam: Physical Exam  ROS  Blood pressure 102/70, pulse 71, temperature 98.3 F (36.8 C), temperature source Oral, resp. rate 16, height  5\' 4"  (1.626 m), weight 143 lb (64.864 kg), SpO2 100 %.Body mass index is 24.53 kg/(m^2).   General Appearance: Casual  Eye Contact:  Fair  Speech:  Slow  Volume:  Decreased  Mood:  Anxious, Depressed and Hopeless  Affect:  Blunt, Constricted and Depressed  Thought Process:  Coherent  Orientation:  Full (Time, Place, and Person)  Thought Content:  Logical  Suicidal Thoughts:  No  Homicidal Thoughts:  No  Memory:  Immediate;   Fair Recent;   Fair Remote;   Fair  Judgement:  Impaired  Insight:  Shallow  Psychomotor Activity:  Decreased  Concentration:  Concentration: Fair and Attention Span: Fair  Recall:  FiservFair  Fund of Knowledge:  Fair  Language:  Fair  Akathisia:  No  Handed:  Right  AIMS (if indicated):     Assets:  Communication Skills Desire for Improvement Financial Resources/Insurance Housing Physical Health Resilience Social Support Vocational/Educational  ADL's:  Intact  Cognition:  WNL  Sleep:  Number of Hours: 6.25         COGNITIVE FEATURES THAT CONTRIBUTE TO RISK:  Thought constriction (tunnel vision)    SUICIDE RISK:   Moderate:  Frequent suicidal ideation with limited intensity, and duration, some specificity in terms of plans, no associated intent, good self-control, limited dysphoria/symptomatology, some risk factors present, and identifiable protective factors, including available and accessible social support.  PLAN OF CARE:  Treatment Plan Summary: Daily contact with patient to assess and evaluate symptoms and progress in treatment and Medication management  Observation Level/Precautions:  15 minute checks  Laboratory:  Per admission labs CBC and comprehensive metabolic panel are largely within normal limits. Urine drug screen positive for cocaine.   Psychotherapy:  Patient to engage in group therapy to address his cocaine abuse and his relationship  stressors. Obtain collateral from family on patient's current ability to handle stressors    Medications:  Continue Prozac at 20 mg daily and Lamictal at 50 mg daily. Will adjust medications as needed   Consultations:  As needed   Discharge Concerns:  Safety and stabilization   Estimated LOS:3-4 days     I certify that inpatient services furnished can reasonably be expected to improve the patient's condition.   Patrick NorthAVI, Bliss Behnke, MD 05/31/2016, 11:54 AM

## 2016-05-31 NOTE — [facility] Suicide Risk Assessment (Signed)
[facility] INPATIENT:  Family/Significant Other Suicide Prevention Education  Suicide Prevention Education:  Patient Refusal for Family/Significant Other Suicide Prevention Education: The patient [name] has refused to provide written consent for family/significant other to be provided Family/Significant Other Suicide Prevention Education during admission and/or prior to discharge.  Physician notified.  SPE brochure was reviewed thoroughly with patient and given to him.  Sarina SerGrossman-Orr, Nguyet Mercer Jo 05/31/2016, 5:44 PM

## 2016-05-31 NOTE — BHH Counselor (Signed)
Adult Comprehensive Assessment  Patient ID: [name], male   DOB: 08/06/1986, 30 y.o.   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 Family Relationships: Baby's mother broke up with him 7 months ago, states he is coming to terms with it Surveyor, quantityinancial / Lack of resources (include bankruptcy): Denies stressors Housing / Lack of housing: Denies stressors Physical health (include injuries & life threatening diseases): Denies stressors Social relationships: Denies stressors - states his social circle is small and he gets along well Substance abuse: Denies stressors Bereavement / Loss: Father died 9 years ago, just lost relationship with girlfriend 7 months ago  Living/Environment/Situation:  Living Arrangements: Parent, Other relatives (Mother, younger sister) Living conditions (as described by patient or guardian): Lives in a house with mother and sister, has his own room, in a calm neighborhood How long has patient lived in current situation?: Most of his life, in and out What is atmosphere in current home: Comfortable, ParamedicLoving, Supportive  Family History:  Marital status: Single Are you sexually active?: Yes What is your sexual orientation?: Straight Does patient have children?: Yes How many children?: 2 How is patient's relationship with their children?: 8yo and 4yo children, wonderful relationship  Childhood History:  By whom was/is the patient raised?: Mother, Father Additional childhood history information: Mostly raised by mother, with father off and on Description of patient's relationship with caregiver when they were a child: Had a "fine, good" relationship with mother growing up, "pretty good" with fathre Patient's description of current relationship with people who raised him/her: Father died 9  years ago.  Relationship with mother is better now. How were you disciplined when you got in trouble as a child/adolescent?: Long talk, raise their voices, grounded, not able to watch TV, given chores Does patient have siblings?: Yes Number of Siblings: 1 Description of patient's current relationship with siblings: younger sister - pretty good relationship with her now Did patient suffer any verbal/emotional/physical/sexual abuse as a child?: No Did patient suffer from severe childhood neglect?: No Has patient ever been sexually abused/assaulted/raped as an adolescent or adult?: No Was the patient ever a victim of a crime or a disaster?: No Witnessed domestic violence?: Yes Has patient been effected by domestic violence as an adult?: No Description of domestic violence: Father and mother  Education:  Highest grade of school patient has completed: High school graduate Currently a student?: No Learning disability?: No  Employment/Work Situation:   Employment situation: Employed Where is patient 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  Has until next week off. What is the longest time patient has a held a job?: 6-1/2 years Where was the patient employed at that time?: current job Has patient ever been in the Eli Lilly and Companymilitary?: No Are There Guns or Other Weapons in Your Home?: No  Financial Resources:   Financial resources: Income from employment, Private insurance Does patient have a representative payee or guardian?: No  Alcohol/Substance Abuse:   What has been your use of drugs/alcohol within the last 12 months?: Cocaine 2 times, Alcohol about a month ago and prior to that was Christmas 2016, Thanksgiving 0216 Alcohol/Substance Abuse Treatment Hx: Denies past  history  Has alcohol/substance abuse ever caused legal problems?: Yes (Got a DWI 1 month ago due to alcohol, is supposed to be going to classes)  Social Support System:   Patient's Community Support System: Good Describe Community Support System: Mother, uncles, sister, cousins Type of faith/religion: Ephriam KnucklesChristian How does patient's faith help to cope with current illness?: [name] is very involved, asks about him, "is there for me."  Leisure/Recreation:   Leisure and Hobbies: Listen to music, go on drives, go to park, walk, be home  Strengths/Needs:   What things does the patient do well?: Drawing, working with hands In what areas does patient struggle / problems for patient: Was struggling about the separation with children's mother, feels he is over it now.  Depression, getting medications  Discharge Plan:   Does patient have access to transportation?: Yes Will patient be returning to same living situation after discharge?: Yes Currently receiving community mental health services: Yes (From Whom) ([address] Michele McalpinePhil [address], [place] Care Phys[place] [address]) Does patient have financial barriers related to discharge medications?: No Patient description of barriers related to discharge medications: Has insurance, income - can afford as long as reasonable cost  Summary/Recommendations:   Summary and Recommendations (to be completed by the evaluator): Patient is a 29yo male admitted to the hospital with suicide attempt by drinking bleach and laying in front of former girlfriend's car and reports primary trigger for admission was his children's  mother breaking up with him 7 months ago.  He had a prior suicide attempt in January 2017 for the same reason, [facility].  Patient will benefit from crisis stabilization, medication evaluation, group therapy and psychoeducation, in addition to case management for discharge  planning. At discharge it is recommended that Patient adhere to the established discharge plan and continue in treatment.  Sarina SerGrossman-Orr, Enrrique Mierzwa Jo. 05/31/2016

## 2016-06-01 DIAGNOSIS — F332 Major depressive disorder, recurrent severe without psychotic features: Secondary | ICD-10-CM | POA: Insufficient documentation

## 2016-06-01 MED ORDER — FLUOXETINE HCL 20 MG PO CAPS
30.0000 mg | ORAL_CAPSULE | Freq: Every day | ORAL | Status: DC
Start: 2016-06-02 — End: 2016-06-03
  Administered 2016-06-02 – 2016-06-03 (×2): 30 mg via ORAL
  Filled 2016-06-01 (×4): qty 1

## 2016-06-01 NOTE — Plan of Care (Signed)
Problem: Activity: Goal: Interest or engagement in leisure activities will improve Outcome: Progressing Client reports "I participated in activities today and I feel better, than when I came" [place] and sang.

## 2016-06-01 NOTE — Plan of Care (Signed)
Problem: Coping: Goal: Ability to cope will improve Outcome: Progressing Client reports he will stay with mom upon discharge, utilizing his family as support system. "nobody like momma"

## 2016-06-01 NOTE — Tx Team (Signed)
Interdisciplinary Treatment Plan Update (Adult) Date: 06/01/2016   Time Reviewed: 9:30 AM  Progress in Treatment: Attending groups: Yes Participating in groups: Yes Taking medication as prescribed: Yes Tolerating medication: Yes Family/Significant other contact made: No, patient has declined collateral contact Patient understands diagnosis: Yes Discussing patient identified problems/goals with staff: Yes Medical problems stabilized or resolved: Yes Denies suicidal/homicidal ideation: Yes Issues/concerns per patient self-inventory: Yes Other:  New problem(s) identified: N/A  Discharge Plan or Barriers: Home with outpatient services.     Reason for Continuation of Hospitalization:  Depression Anxiety Medication Stabilization   Comments: N/A  Estimated length of stay: 1-2 days  Patient is a 30yo male admitted to the hospital with suicide attempt by drinking bleach and laying in front of former girlfriend's car and reports primary trigger for admission was his children's mother breaking up with him 7 months ago. He had a prior suicide attempt in January 2017 for the same reason, was hospitalized at [facility]. Patient will benefit from crisis stabilization, medication evaluation, group therapy and psychoeducation, in addition to case management for discharge planning. At discharge it is recommended that Patient adhere to the established discharge plan and continue in treatment.  Review of initial/current patient goals per problem list:  1. Goal(s): Patient will participate in aftercare plan   Met: Yes   Target date: 3-5 days post admission date   As evidenced by: Patient will participate within aftercare plan AEB aftercare provider and housing plan at discharge being identified.  6/23: Goal met. Patient plans to return home to follow up with outpatient services.     2. Goal (s): Patient will exhibit decreased depressive symptoms and suicidal ideations.   Met:  Yes   Target date: 3-5 days post admission date   As evidenced by: Patient will utilize self rating of depression at 3 or below and demonstrate decreased signs of depression or be deemed stable for discharge by MD.  6/23: Goal met. Patient rates depression at 0, denies SI.     4. Goal(s): Patient will demonstrate decreased signs of withdrawal due to substance abuse   Met: Yes   Target date: 3-5 days post admission date   As evidenced by: Patient will produce a CIWA/COWS score of 0, have stable vitals signs, and no symptoms of withdrawal  6/23: Goal met. No withdrawal symptoms reported at this time per medical chart.   Attendees: Patient:    Family:    Physician: Dr. Einar Grad; Dr. Shea Evans 06/01/2016 9:30 AM  Nursing: Grayland Ormond, Nelly Rout [place], RN 06/01/2016 9:30 AM  Clinical Social Worker: Tilden Fossa, LCSW 06/01/2016 9:30 AM  Other: Peri Maris, LCSWA; [place], LCSW  06/01/2016 9:30 AM  Other:  06/01/2016 9:30 AM  Other: Lars Pinks, Case Manager 06/01/2016 9:30 AM  Other: Agustina Caroli, May Augustin, NP 06/01/2016 9:30 AM  Other:      Scribe for Treatment Team:  Tilden Fossa, [place]

## 2016-06-01 NOTE — Progress Notes (Signed)
Patient ID: [name], male   DOB: 12/06/1986, 30 y.o.   MRN: [id number] D: Client seen in dayroom watching TV interacting with peers, reports "I would like to go home early Sunday so I can go to church" Client reports groups are helpful "I would like to go to groups outside of here"  [facility]. Client talks about how beautiful his kids are, smiling when he talks about them. A: Writer provides emotional support encourages client to refrain from making impulsive decisions that may keep him away from children forever. Client agrees "yea" Medications reviewed administered as ordered. Staff will monitor q4215min for safety. R: Client is safe on the unit, attended group.

## 2016-06-01 NOTE — B[facility] Group Notes (Signed)
[facility] LCSW Group Therapy 06/01/2016 1:15 PM Type of Therapy: Group Therapy Participation Level: Active  Participation Quality: Attentive  Affect: Appropriate  Cognitive: Alert and Oriented  Insight: Developing/Improving and Engaged  Engagement in Therapy: Developing/Improving and Engaged  Modes of Intervention: Clarification, Confrontation, Discussion, Education, Exploration, Limit-setting, Orientation, Problem-solving, Rapport Building, Dance movement psychotherapisteality Testing, Socialization and Support  Summary of Progress/Problems: The topic for today was feelings about relapse. Pt discussed what relapse prevention is to them and identified triggers that they are on the path to relapse. Pt processed their feeling towards relapse and was able to relate to peers. Pt discussed coping skills that can be used for relapse prevention. Patient discussed having conflict with his ex-girlfriend and wanting to be involved with his children. CSW and other group members provided patient with emotional support and encouragement.    Samuella BruinKristin Meta Kroenke, MSW, LCSW Clinical Social Worker [facility] [phone]

## 2016-06-01 NOTE — Plan of Care (Signed)
Problem: Education: Goal: Mental status will improve Outcome: Progressing Patient A&O. Rates his depression, anxiety and hopelessness at a 1/10.  Problem: Medication: Goal: Compliance with prescribed medication regimen will improve Outcome: Progressing Patient has been med compliant.

## 2016-06-01 NOTE — Progress Notes (Signed)
Patient up and visible in the milieu. Animated in affect, mood pleasant. Rates his depression, hopelessness and anxiety all at a 1/10. States his sleep, appetite and concentration are all "good." Rates his energy level as "low." Continues to complain of L shoulder pain, most recently at an 8/10. States tylenol is not helpful. No other physical issues.  Medicated per orders. Education provided. Heat packs given for discomfort. Emotional support offered. Self inventory reviewed and encouraged completion of SSP as well as offered assistance.  Patient verbalized understanding. Denies SI/HI and remains safe on level III obs.

## 2016-06-01 NOTE — B[facility] Group Notes (Signed)
   B[facility] LCSW Aftercare Discharge Planning Group Note  6/23/20[facility]17  8:45 AM   Participation Quality: Alert, Appropriate and Oriented  Mood/Affect: Appropriate  Depression Rating: 0  Anxiety Rating: 0  Thoughts of Suicide: Pt denies SI/HI  Will you contract for safety? Yes  Current AVH: Pt denies  Plan for Discharge/Comments: Pt attended discharge planning group and actively participated in group. CSW provided pt with today's workbook. Patient plans to return home to follow up with current outpatient providers  Transportation Means: Pt reports access to transportation  Supports: No supports mentioned at this time  Samuella BruinKristin Maricella Filyaw, MSW, LCSW Clinical Social Worker [facility] [phone]

## 2016-06-01 NOTE — Progress Notes (Signed)
[facility]  06/01/2016 4:18 PM [name]  MRN:  [id number] Subjective:  Patient reports doing better today. States his girlfriend and he have decided to go their separate ways and be there for the kids. States feeling more at peace today. Fair sleepa nd appetite. Denies any suicidal thoughts. Enjoyed his visit with mother and sister.  Principal Problem: <principal problem not specified> Diagnosis:   Patient Active Problem List   Diagnosis Date Noted  . MDD (major depressive disorder), recurrent episode, severe (HCC) [F33.2] 05/30/2016  . Alcohol abuse [F10.10] 05/13/2016  . Cocaine abuse [F14.10] 05/13/2016  . Cocaine-induced mood disorder (HCC) [F14.94] 05/13/2016  . Overdose by acetaminophen [T39.1X4A] 02/01/2016  . 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  Past Medical History:  Past Medical History  Diagnosis Date  . 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 No pertinent past surgical history. Family History:  Family History  Problem Relation Age of Onset  . Hypertension Mother    Family Psychiatric  History: none Social History:  History  Alcohol Use  . Yes    Comment: occassional     History  Drug Use No    Social History   Social History  . Marital Status: Married    Spouse Name: N/A  . Number of Children: N/A  . Years of Education: N/A   Social History Main Topics  . Smoking status: Former Smoker -- 0.25 packs/day    Types: Cigarettes    Quit date: 07/24/2012  . Smokeless tobacco: Never Used  . Alcohol Use: Yes     Comment: occassional  . Drug Use: No  . Sexual Activity: Yes   Other Topics Concern  . None   Social History Narrative   Additional Social History:                         Sleep: Fair  Appetite:   Fair  Current Medications: Current Facility-Administered Medications  Medication Dose Route Frequency Provider Last Rate Last Dose  . acetaminophen (TYLENOL) tablet 650 mg  650 mg Oral Q6H PRN Kerry HoughSpencer E Simon, PA-C   650 mg at 06/01/16 1435  . alum & mag hydroxide-simeth (MAALOX/MYLANTA) 200-200-20 MG/5ML suspension 30 mL  30 mL Oral Q4H PRN Kerry HoughSpencer E Simon, PA-C      . FLUoxetine (PROZAC) capsule 20 mg  20 mg Oral Daily Craige CottaFernando A Cobos, MD   20 mg at 06/01/16 [id number]  . hydrOXYzine (ATARAX/VISTARIL) tablet 25 mg  25 mg Oral Q6H PRN Kerry HoughSpencer E Simon, PA-C      . lamoTRIgine (LAMICTAL) tablet 50 mg  50 mg Oral QPM Kerry HoughSpencer E Simon, PA-C   50 mg at 05/31/16 1644  . magnesium hydroxide (MILK OF MAGNESIA) suspension 30 mL  30 mL Oral Daily PRN Kerry HoughSpencer E Simon, PA-C      . traZODone (DESYREL) tablet 50 mg  50 mg Oral QHS,MR X 1 Kerry HoughSpencer E Simon, PA-C   50 mg at 05/31/16 2205    Lab Results:  Results for orders placed or performed during the hospital encounter of 05/30/16 (from the past 48 hour(s))  Lithium level     Status: Abnormal   Collection Time: 05/30/16  7:50 PM  Result Value Ref Range   Lithium Lvl <0.06 (L) 0.60 -  1.20 mmol/L    Blood Alcohol level:  Lab Results  Component Value Date   ETH <5 05/30/2016   ETH 144* 05/12/2016    Metabolic Disorder Labs: No results found for: HGBA1C, MPG No results found for: PROLACTIN Lab Results  Component Value Date   CHOL 113* 12/06/2015   TRIG 95 12/06/2015   HDL 36* 12/06/2015   CHOLHDL 3.1 12/06/2015   VLDL 19 12/06/2015   LDLCALC 58 12/06/2015   LDLCALC 75 09/23/2012    Physical Findings: AIMS: Facial and Oral Movements Muscles of Facial Expression: None, normal Lips and Perioral Area: None, normal Jaw: None, normal Tongue: None, normal,Extremity Movements Upper (arms, wrists, hands, fingers): None, normal Lower (legs, knees, ankles, toes): None, normal, Trunk Movements Neck, shoulders, hips: None, normal, Overall  Severity Severity of abnormal movements (highest score from questions above): None, normal Incapacitation due to abnormal movements: None, normal Patient's awareness of abnormal movements (rate only patient's report): No Awareness, Dental Status Current problems with teeth and/or dentures?: No Does patient usually wear dentures?: No  CIWA:    COWS:     Musculoskeletal: Strength & Muscle Tone: within normal limits Gait & Station: normal Patient leans: N/A  Psychiatric Specialty Exam: Physical Exam  ROS  Blood pressure 119/71, pulse 54, temperature 98.2 F (36.8 C), temperature source Oral, resp. rate 16, height 5\' 4"  (1.626 m), weight 143 lb (64.864 kg), SpO2 100 %.Body mass index is 24.53 kg/(m^2).  General Appearance: Casual  Eye Contact:  Fair  Speech:  Slow  Volume:  Decreased  Mood:  Anxious and Depressed  Affect:  Congruent  Thought Process:  Coherent  Orientation:  Negative  Thought Content:  WDL and Rumination  Suicidal Thoughts:  No  Homicidal Thoughts:  No  Memory:  Immediate;   Fair Recent;   Fair Remote;   Fair  Judgement:  improving  Insight:  Fair and Shallow  Psychomotor Activity:  Normal  Concentration:  Concentration: Fair and Attention Span: Fair  Recall:  [employer]Fair  Fund of Knowledge:  Fair  Language:  Fair  Akathisia:  No  Handed:  Right  AIMS (if indicated):     Assets:  Communication Skills Desire for Improvement Financial Resources/Insurance Housing Resilience Social Support Vocational/Educational  ADL's:  Intact  Cognition:  WNL  Sleep:  Number of Hours: 5.75     Treatment Plan Summary: Daily contact with patient to assess and evaluate symptoms and progress in treatment and Medication management   MDD Increase Prozac to 30mg  po qd. Patient to engage in groups and devlop coping skills to deal with his stressors. Patient to develop action alternatives to suicidal thoughts. Monitor for mood and safety  Substance abuse Patient to develop  coping skills to deal with his emotions and restrict substancea buse.  Patrick NorthAVI, Alexandra Lipps, MD 06/01/2016, 4:18 PM

## 2016-06-01 NOTE — Progress Notes (Signed)
  [facility] Dis[facility] :  Will you be returning to the same living situation after discharge:  Yes,  patient plans to return home At discharge, do you have transportation home?: Yes,  family/friends Do you have the ability to pay for your medications: Yes,  patient will be provided with prescriptions at discharge  Release of information consent forms completed and in the chart;  Patient's signature needed at discharge.  Patient to Follow up at: Follow-up Information    Follow up with Federico FlakePhil Barrineau, LPC.   Why:  Tentative therapy appt on Friday June 30th at 4pm. Call on Monday to confirm with Phil. You have another therapy appt scheduled for Friday July 7th at 5pm. Call if you need to reschedule.    Contact information:   [place], [place] [phone]       Follow up with [facility].   Why:  Walk-in Monday-Friday between 8am-5pm or Saturday 8am-3pm within 30 days for medication management appointment.    Contact information:   [address] [place], [place] [id number] [phone]      Next level of care provider [facility] Link:no  Safety Planning and Suicide Prevention discussed: Yes,  with patient  Have you used any form of tobacco in the last 30 days? (Cigarettes, Smokeless Tobacco, Cigars, and/or Pipes): Yes  Has patient been referred to the Quitline?: Patient refused referral  Patient has been referred for addiction treatment: Yes  Kingsten Enfield, [place] CarboKristin L 06/01/2016, 1:04 PM

## 2016-06-02 NOTE — Progress Notes (Signed)
[facility] MD Progress Note  06/02/2016 12:30 PM [name]  MRN:  [id number] Subjective:  Patient reports " I need to be discharge early tomorrow because I would like to attend church."   Objective:[name] is awake, alert and oriented X4. Seen resting in bedroom. Patient is attending and participating  group session.  Denies suicidal or homicidal ideation. Denies auditory or visual hallucination and does not appear to be responding to internal stimuli. Patient interacts well with staff and others. Patient reports he is medication compliant without mediation side effects. Report learning new coping skills to think and talk about problems with his family. Patient denies depression today. Reports good appetite and stated he resting well. Patient report he is excited regarding discharge. Support, encouragement and reassurance was provided.   Principal Problem: MDD (major depressive disorder), recurrent episode, severe (HCC) Diagnosis:   Patient Active Problem List   Diagnosis Date Noted  . Severe episode of recurrent major depressive disorder, without psychotic features (HCC) [F33.2]   . MDD (major depressive disorder), recurrent episode, severe (HCC) [F33.2] 05/30/2016  . Alcohol abuse [F10.10] 05/13/2016  . Cocaine abuse [F14.10] 05/13/2016  . Cocaine-induced mood disorder (HCC) [F14.94] 05/13/2016  . Overdose by acetaminophen [T39.1X4A] 02/01/2016  . 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  Past Medical History:  Past Medical History  Diagnosis Date  . 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 No pertinent past surgical history. Family History:  Family History  Problem Relation Age of Onset  . Hypertension Mother    Family Psychiatric  History: none Social History:   History  Alcohol Use  . Yes    Comment: occassional     History  Drug Use No    Social History   Social History  . Marital Status: Married    Spouse Name: N/A  . Number of Children: N/A  . Years of Education: N/A   Social History Main Topics  . Smoking status: Former Smoker -- 0.25 packs/day    Types: Cigarettes    Quit date: 07/24/2012  . Smokeless tobacco: Never Used  . Alcohol Use: Yes     Comment: occassional  . Drug Use: No  . Sexual Activity: Yes   Other Topics Concern  . None   Social History Narrative   Additional Social History:                         Sleep: Fair  Appetite:  Fair  Current Medications: Current Facility-Administered Medications  Medication Dose Route Frequency Provider Last Rate Last Dose  . acetaminophen (TYLENOL) tablet 650 mg  650 mg Oral Q6H PRN Kerry Hough, PA-C   650 mg at 06/02/16 8295  . alum & mag hydroxide-simeth (MAALOX/MYLANTA) 200-200-20 MG/5ML suspension 30 mL  30 mL Oral Q4H PRN Kerry Hough, PA-C      . FLUoxetine (PROZAC) capsule 30 mg  30 mg Oral Daily Himabindu Ravi, MD   30 mg at 06/02/16 6213  . hydrOXYzine (ATARAX/VISTARIL) tablet 25 mg  25 mg Oral Q6H PRN Kerry Hough, PA-C      . lamoTRIgine (LAMICTAL) tablet 50 mg  50 mg Oral QPM Kerry Hough, PA-C   50 mg at 06/01/16 1718  . magnesium hydroxide (MILK OF MAGNESIA) suspension 30 mL  30 mL Oral Daily PRN Kerry HoughSpencer E Simon, PA-C      . traZODone (DESYREL) tablet 50 mg  50 mg Oral QHS,MR X 1 Kerry HoughSpencer E Simon, PA-C   50 mg at 06/01/16 2122    Lab Results:  No results found for this or any previous visit (from the past 48 hour(s)).  Blood Alcohol level:  Lab Results  Component Value Date   ETH <5 05/30/2016   ETH 144* 05/12/2016    Metabolic Disorder Labs: No results found for: HGBA1C, MPG No results found for: PROLACTIN Lab Results  Component Value Date   CHOL 113* 12/06/2015   TRIG 95 12/06/2015   HDL 36* 12/06/2015   CHOLHDL 3.1  12/06/2015   VLDL 19 12/06/2015   LDLCALC 58 12/06/2015   LDLCALC 75 09/23/2012    Physical Findings: AIMS: Facial and Oral Movements Muscles of Facial Expression: None, normal Lips and Perioral Area: None, normal Jaw: None, normal Tongue: None, normal,Extremity Movements Upper (arms, wrists, hands, fingers): None, normal Lower (legs, knees, ankles, toes): None, normal, Trunk Movements Neck, shoulders, hips: None, normal, Overall Severity Severity of abnormal movements (highest score from questions above): None, normal Incapacitation due to abnormal movements: None, normal Patient's awareness of abnormal movements (rate only patient's report): No Awareness, Dental Status Current problems with teeth and/or dentures?: No Does patient usually wear dentures?: No  CIWA:    COWS:     Musculoskeletal: Strength & Muscle Tone: within normal limits Gait & Station: normal Patient leans: N/A  Psychiatric Specialty Exam: Physical Exam  Vitals reviewed. Constitutional: He is oriented to person, place, and time. He appears well-developed.  HENT:  Head: Normocephalic.  Musculoskeletal: Normal range of motion.  Neurological: He is alert and oriented to person, place, and time.  Psychiatric: He has a normal mood and affect. His behavior is normal.    Review of Systems  Psychiatric/Behavioral: Negative for suicidal ideas and hallucinations. Depression: stable. Nervous/anxious: stable.   All other systems reviewed and are negative.   Blood pressure 123/83, pulse 59, temperature 98.1 F (36.7 C), temperature source Oral, resp. rate 16, height 5\' 4"  (1.626 m), weight 64.864 kg (143 lb), SpO2 100 %.Body mass index is 24.53 kg/(m^2).  General Appearance: Casual  Eye Contact:  Fair  Speech:  Slow  Volume:  Decreased  Mood:  Anxious and Depressed  Affect:  Congruent  Thought Process:  Coherent  Orientation:  Negative  Thought Content:  WDL and Rumination  Suicidal Thoughts:  No  Homicidal  Thoughts:  No  Memory:  Immediate;   Fair Recent;   Fair Remote;   Fair  Judgement:  improving  Insight:  Fair and Shallow  Psychomotor Activity:  Normal  Concentration:  Concentration: Fair and Attention Span: Fair  Recall:  [employer]Fair  Fund of Knowledge:  Fair  Language:  Fair  Akathisia:  No  Handed:  Right  AIMS (if indicated):     Assets:  Communication Skills Desire for Improvement Financial Resources/Insurance Housing Resilience Social Support Vocational/Educational  ADL's:  Intact  Cognition:  WNL  Sleep:  Number of Hours: 6.75    I agree with current treatment plan on 06/02/2016, Patient seen face-to-face for psychiatric evaluation follow-up, chart reviewed. Reviewed the information documented and agree with the treatment plan.  Treatment Plan Summary: Daily contact with patient to assess and evaluate symptoms and progress in treatment and Medication management   Continue  Prozac to 30mg  po qd for depression Continue Trazodone 50 mg PO QHS for insomnia  Patient to engage in groups and develop coping skills to deal with his stressors. Patient to develop action alternatives to suicidal thoughts. Monitor for mood and safety Substance abuse Patient to develop coping skills to deal with his emotions and restrict substance abuse.l Will continue to monitor vitals ,medication compliance and treatment side effects while patient is here.  CSW will start working on disposition.  Patient to participate in therapeutic milieu   Oneta Rackanika N Lewis, NP 06/02/2016, 12:30 PM
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
## 2016-06-03 MED ORDER — LAMOTRIGINE 25 MG PO TABS: 50.0000 mg | ORAL_TABLET | Freq: Every evening | ORAL | Status: DC

## 2016-06-03 MED ORDER — FLUOXETINE HCL 10 MG PO CAPS: 30.0000 mg | ORAL_CAPSULE | Freq: Every day | ORAL | Status: DC
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
## 2016-06-03 NOTE — Progress Notes (Signed)
Patient has been up and active on the unit, attended group this evening and has voiced no complaints. He is looking forward to discharge on tomorrow and hopes to discharge in am so that he can have breakfast and attend church with his family. Writer informed him that his time of discharge will be left up to the doctor on for tomorrow and information would be passed on but not to inform family until sure of time.  Patient currently is -si/hi/a/v hall. Support and encouragement offered, safety maintained on unit, will continue to monitor.

## 2016-06-03 NOTE — [facility] Suicide Risk Assessment (Signed)
[facility]   Principal Problem: MDD (major depressive disorder), recurrent episode, severe (HCC) Discharge Diagnoses:  Patient Active Problem List   Diagnosis Date Noted  . Severe episode of recurrent major depressive disorder, without psychotic features (HCC) [F33.2]   . MDD (major depressive disorder), recurrent episode, severe (HCC) [F33.2] 05/30/2016  . Alcohol abuse [F10.10] 05/13/2016  . Cocaine abuse [F14.10] 05/13/2016  . Cocaine-induced mood disorder (HCC) [F14.94] 05/13/2016  . Overdose by acetaminophen [T39.1X4A] 02/01/2016  . 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 rate 16, height 5\' 4"  (1.626 m), weight 64.864 kg (143 lb), SpO2 100 %.Body mass index is 24.53 kg/(m^2).  General Appearance: Casual  Eye Contact::  Good  Speech:  Clear and Coherent409  Volume:  Normal  Mood:  Euthymic  Affect:  Appropriate and Congruent  Thought Process:  Coherent and Goal Directed  Orientation:  Full (Time, Place, and Person)  Thought Content:  Logical  Suicidal Thoughts:  No  Homicidal Thoughts:  No  Memory:  Immediate;   Good Recent;   Fair Remote;   Fair  Judgement:  Intact  Insight:  Good  Psychomotor Activity:  Normal  Concentration:  Good  Recall:  Good  Fund of Knowledge:Good  Language: Good  Akathisia:  Negative  Handed:  Right  AIMS (if indicated):     Assets:  Communication Skills Desire for Improvement Financial Resources/Insurance Housing Intimacy Leisure Time Physical Health Resilience Social Support Talents/Skills Transportation Vocational/Educational  Sleep:  Number of Hours: 6.75  Cognition: WNL  ADL's:  Intact   Mental Status Per Nursing  Assessment::   On Admission:  Self-harm thoughts, Self-harm behaviors  Demographic Factors:  Male and Adolescent or young adult  Loss Factors: Decrease in vocational status and Loss of significant relationship  Historical Factors: Prior suicide attempts and Impulsivity  Risk Reduction Factors:   Responsible for children under 30 years of age, Sense of responsibility to family, Religious beliefs about death, Employed, Living with another person, especially a relative, Positive social support, Positive therapeutic relationship and Positive coping skills or problem solving skills  Continued Clinical Symptoms:  Depression:   Comorbid alcohol abuse/dependence Recent sense of peace/wellbeing Alcohol/Substance Abuse/Dependencies Unstable or Poor Therapeutic Relationship Previous Psychiatric Diagnoses and Treatments  Cognitive Features That Contribute To Risk:  Polarized thinking    Suicide Risk:  Mild:  Suicidal ideation of limited frequency, intensity, duration, and specificity.  There are no identifiable plans, no associated intent, mild dysphoria and related symptoms, good self-control (both objective and subjective assessment), few other risk factors, and identifiable protective factors, including available and accessible social support.  Follow-up Information    Follow up with Federico FlakePhil Barrineau, LPC.   Why:  Therapy appt on Friday June 30th at 4pm. You have another therapy appt scheduled for Friday July 7th at 5pm. Call if you need to reschedule.    Contact information:   [place] [id number] [phone]       Follow up with [facility].   Why:  Walk-in Monday-Friday between 8am-5pm or Saturday 8am-3pm within 30 days for medication management appointment.    Contact information:   [address] [place], [place] [address] [phone]      Plan Of Care/Follow-up recommendations:  Activity:  As tolerated Diet:  Regular  Leata MouseJANARDHANA Bryann Mcnealy,  MD 06/03/2016, 9:58 AM

## 2016-06-03 NOTE — Progress Notes (Signed)
Psychoeducational Group Note  Date:  06/03/2016 Time:  0900  Group Topic/Focus:  Daily Goals Group:  The group focuses on teaching patients how to set attainable daily goals that will aid them in their recovery.Also discussed issues they want to discuss with the practitioner today.    Participation Level:  Active   Additional Comments:    Rich BraveDuke, Maddax Palinkas Lynn 10:56 AM. 06/03/2016

## 2016-06-03 NOTE — [facility] Group Notes (Signed)
Adult Therapy Group Note  Date: 06/03/2016 Time: 10-11AM  Group Topic/Focus:  Today's group focused on introducing the concept of the impact of thoughts, feelings and behaviors on each other, and how it is thought that behavioral changes are the easiest to target, often leading to changes in feelings and thoughts. We evaluated excellent supports who would receive a score of A+ versus mediocre supports (C) and poor supports (F-). We then talked extensively about how to use healthy supports to increase healthy behaviors and reduce the unhealthy supports in order to be better protected against engaging in healthy behaviors.  Participation Level: Active  Participation Quality: Attentive  Affect: Sharing  Cognitive: Appropriate  Insight: Good  Engagement in Group: Engaged  Modes of Intervention: Clarification and Exploration  Additional Comments: Pt was only present for the first part of group, left after about 25 minutes in order to be discharged.  Sarina SerGrossman-Orr, Jaasiel Hollyfield Jo 06/03/2016, 1:02 PM

## 2016-06-08 DIAGNOSIS — F4323 Adjustment disorder with mixed anxiety and depressed mood: Secondary | ICD-10-CM | POA: Diagnosis not present

## 2016-06-14 ENCOUNTER — Ambulatory Visit (INDEPENDENT_AMBULATORY_CARE_PROVIDER_SITE_OTHER): Payer: BLUE CROSS/BLUE SHIELD | Admitting: Physician Assistant

## 2016-06-14 VITALS — BP 133/88 | HR 79 | Temp 98.2°F | Resp 16 | Ht 65.0 in | Wt 153.0 lb

## 2016-06-14 DIAGNOSIS — M940 Chondrocostal junction syndrome [Tietze]: Secondary | ICD-10-CM

## 2016-06-14 DIAGNOSIS — R0789 Other chest pain: Secondary | ICD-10-CM

## 2016-06-14 MED ORDER — MELOXICAM 7.5 MG PO TABS: 7.5000 mg | ORAL_TABLET | Freq: Two times a day (BID) | ORAL | Status: DC

## 2016-06-14 NOTE — Progress Notes (Signed)
[name]  MRN: [id number] DOB: 05/02/1986  Subjective:  Pt presents to clinic with a week h/o chest wall pain.  He was on vacation and the pain started to feel better but then when he went back to work the pain started getting worse and then the pain today made him leave work because it was so bad.  He is having no nausea, no SOB but he does have pain with deep breathing.  He has not used cocaine in the last 2 weeks and he has never had problems with his heart as a result of its use to the best of his knowledge.  He has also not used other illegal substances in the last 2 weeks.  He has been taking his mental health medications as directed.  The pain is worse when he moves and lifts up anything - he can push on his right upper chest and cause the pain.   He can sometimes find a position where he has no pain but it is hard.  He has a strenuous job with lifting heavy stuff.   Advil has not helped much.  Patient Active Problem List   Diagnosis Date Noted  . Severe episode of recurrent major depressive disorder, without psychotic features (HCC)   . MDD (major depressive disorder), recurrent episode, severe (HCC) 05/30/2016  . Alcohol abuse 05/13/2016  . Cocaine abuse 05/13/2016  . 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 Overdose by acetaminophen 02/01/2016  . 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    Current Outpatient Prescriptions on File Prior to Visit  Medication Sig Dispense Refill  . FLUoxetine (PROZAC) 10 MG capsule Take 3 capsules (30 mg total) by mouth daily. 30 capsule 0  . lamoTRIgine (LAMICTAL) 25 MG tablet Take 2 tablets (50 mg total) by mouth every evening. 30 tablet 0   No current facility-administered medications on file prior to visit.    No Known Allergies  Review of Systems  Constitutional: Negative for fever and chills.  HENT: Negative.   Respiratory: Negative for cough, shortness of breath and wheezing.   Cardiovascular: Positive for chest pain.  Negative for palpitations and leg swelling.  Gastrointestinal: Negative for abdominal pain.       No heartburn or indigestion symptoms   Objective:  BP 133/88 mmHg  Pulse 79  Temp(Src) 98.2 F (36.8 C) (Oral)  Resp 16  Ht 5\' 5"  (1.651 m)  Wt 153 lb (69.4 kg)  BMI 25.46 kg/m2  SpO2 99%  Physical Exam  Constitutional: He is oriented to person, place, and time and well-developed, well-nourished, and in no distress.  HENT:  Head: Normocephalic and atraumatic.  Right Ear: External ear normal.  Left Ear: External ear normal.  Eyes: Conjunctivae are normal.  Neck: Normal range of motion.  Cardiovascular: Normal rate, regular rhythm and normal heart sounds.   No murmur heard. Pulmonary/Chest: Effort normal and breath sounds normal. He has no wheezes. He exhibits tenderness.    Abdominal: Soft. Bowel sounds are normal.  Neurological: He is alert and oriented to person, place, and time. Gait normal.  Skin: Skin is warm and dry.  Psychiatric: Mood, memory, affect and judgment normal.   EKG - NSR without acute change - the same as EKG from ED visit in june Assessment and Plan :  Other chest pain - Plan: EKG 12-Lead  Chest wall pain  Costochondritis - Plan: meloxicam (MOBIC) 7.5 MG tablet   Pt has reproducible pain with a normal EKG - we will treat with NSAIDs and  rest - we discussed that this may take several weeks for resolution esp with his line of work  D/w Dr Carman ChingGreene  Sarah Weber PA-C  [facility] 06/14/2016 11:32 AM

## 2016-06-14 NOTE — Patient Instructions (Addendum)
  Costochondritis Costochondritis, sometimes called Tietze syndrome, is a swelling and irritation (inflammation) of the tissue (cartilage) that connects your ribs with your breastbone (sternum). It causes pain in the chest and rib area. Costochondritis usually goes away on its own over time. It can take up to 6 weeks or longer to get better, especially if you are unable to limit your activities. CAUSES  Some cases of costochondritis have no known cause. Possible causes include:  Injury (trauma).  Exercise or activity such as lifting.  Severe coughing. SIGNS AND SYMPTOMS  Pain and tenderness in the chest and rib area.  Pain that gets worse when coughing or taking deep breaths.  Pain that gets worse with specific movements. DIAGNOSIS  Your health care provider will do a physical exam and ask about your symptoms. Chest X-rays or other tests may be done to rule out other problems. TREATMENT  Costochondritis usually goes away on its own over time. Your health care provider may prescribe medicine to help relieve pain. HOME CARE INSTRUCTIONS   Avoid exhausting physical activity. Try not to strain your ribs during normal activity. This would include any activities using chest, abdominal, and side muscles, especially if heavy weights are used.  Apply ice to the affected area for the first 2 days after the pain begins.  Put ice in a plastic bag.  Place a towel between your skin and the bag.  Leave the ice on for 20 minutes, 2-3 times a day.  Only take over-the-counter or prescription medicines as directed by your health care provider. SEEK MEDICAL CARE IF:  You have redness or swelling at the rib joints. These are signs of infection.  Your pain does not go away despite rest or medicine. SEEK IMMEDIATE MEDICAL CARE IF:   Your pain increases or you are very uncomfortable.  You have shortness of breath or difficulty breathing.  You cough up blood.  You have worse chest pains,  sweating, or vomiting.  You have a fever or persistent symptoms for more than 2-3 days.  You have a fever and your symptoms suddenly get worse. MAKE SURE YOU:   Understand these instructions.  Will watch your condition.  Will get help right away if you are not doing well or get worse.   This information is not intended to replace advice given to you by your health care provider. Make sure you discuss any questions you have with your health care provider.   Document Released: 09/05/2005 Document Revised: 09/16/2013 Document Reviewed: 06/30/2013 Elsevier Interactive Patient Education 2016 Elsevier Inc.    IF you received an x-ray today, you will receive an invoice from [facility]. Please contact [facility] at [phone] with questions or concerns regarding your invoice.   IF you received labwork today, you will receive an invoice from [facility]. Please contact [facility] at [phone] with questions or concerns regarding your invoice.   Our billing staff will not be able to assist you with questions regarding bills from these companies.  You will be contacted with the lab results as soon as they are available. The fastest way to get your results is to activate your My Chart account. Instructions are located on the last page of this paperwork. If you have not heard from us regarding the results in 2 weeks, please contact this office.     

## 2016-06-15 DIAGNOSIS — F4323 Adjustment disorder with mixed anxiety and depressed mood: Secondary | ICD-10-CM | POA: Diagnosis not present

## 2016-06-29 DIAGNOSIS — F4323 Adjustment disorder with mixed anxiety and depressed mood: Secondary | ICD-10-CM | POA: Diagnosis not present

## 2016-07-04 ENCOUNTER — Ambulatory Visit (INDEPENDENT_AMBULATORY_CARE_PROVIDER_SITE_OTHER): Payer: BLUE CROSS/BLUE SHIELD

## 2016-07-04 ENCOUNTER — Ambulatory Visit (INDEPENDENT_AMBULATORY_CARE_PROVIDER_SITE_OTHER): Payer: BLUE CROSS/BLUE SHIELD | Admitting: Physician Assistant

## 2016-07-04 VITALS — BP 110/70 | HR 60 | Temp 98.7°F | Resp 16 | Ht 65.0 in | Wt 154.0 lb

## 2016-07-04 DIAGNOSIS — S29011A Strain of muscle and tendon of front wall of thorax, initial encounter: Secondary | ICD-10-CM

## 2016-07-04 DIAGNOSIS — S29091A Other injury of muscle and tendon of front wall of thorax, initial encounter: Secondary | ICD-10-CM | POA: Diagnosis not present

## 2016-07-04 DIAGNOSIS — R0789 Other chest pain: Secondary | ICD-10-CM

## 2016-07-04 DIAGNOSIS — R0781 Pleurodynia: Secondary | ICD-10-CM | POA: Diagnosis not present

## 2016-07-04 MED ORDER — CYCLOBENZAPRINE HCL 5 MG PO TABS: 5.0000 mg | ORAL_TABLET | Freq: Three times a day (TID) | ORAL | 1 refills | Status: DC | PRN

## 2016-07-04 NOTE — Patient Instructions (Addendum)
     IF you received an x-ray today, you will receive an invoice from [facility]. Please contact [facility] at [phone] with questions or concerns regarding your invoice.   IF you received labwork today, you will receive an invoice from [employer]. Please contact [facility] at [phone] with questions or concerns regarding your invoice.   Our billing staff will not be able to assist you with questions regarding bills from these companies.  You will be contacted with the lab results as soon as they are available. The fastest way to get your results is to activate your My Chart account. Instructions are located on the last page of this paperwork. If you have not heard from [place] regarding the results in 2 weeks, please contact this office.      HOME CARE  Rest as needed. Limit your activity as told by your doctor.  If directed, put ice on the injured area:  Put ice in a plastic bag.  Place a towel between your skin and the bag.  Leave the ice on for 20 minutes, 2-3 times per day.  Take over-the-counter and prescription medicines only as told by your doctor.  Begin doing exercises as told by your doctor or physical therapist.  Warm up before being active.  Bend your knees before you lift heavy objects.  Keep all follow-up visits as told by your doctor. This is important. GET HELP IF:  Your pain is not helped by medicine.  Your pain, bruising, or swelling is getting worse.  You have a fever. GET HELP RIGHT AWAY IF:  You have shortness of breath.  You have chest pain.  You have weakness or loss of feeling (numbness) in your legs.  You cannot control when you pee (urinate).   This information is not intended to replace advice given to you by your health care provider. Make sure you discuss any questions you have with your health care provider.   Document Released: 05/14/2008 Document Revised: 08/17/2015 Document  Reviewed: 01/20/2015 Elsevier Interactive Patient Education [employer]
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
## 2016-07-06 DIAGNOSIS — F4323 Adjustment disorder with mixed anxiety and depressed mood: Secondary | ICD-10-CM | POA: Diagnosis not present

## 2016-07-09 ENCOUNTER — Ambulatory Visit (INDEPENDENT_AMBULATORY_CARE_PROVIDER_SITE_OTHER): Payer: BLUE CROSS/BLUE SHIELD | Admitting: Family Medicine

## 2016-07-09 VITALS — BP 122/74 | HR 87 | Temp 98.4°F | Resp 17 | Ht 65.0 in | Wt 156.0 lb

## 2016-07-09 DIAGNOSIS — S29011D Strain of muscle and tendon of front wall of thorax, subsequent encounter: Secondary | ICD-10-CM

## 2016-07-09 DIAGNOSIS — S29091D Other injury of muscle and tendon of front wall of thorax, subsequent encounter: Secondary | ICD-10-CM | POA: Diagnosis not present

## 2016-07-09 MED ORDER — DICLOFENAC SODIUM 75 MG PO TBEC: 75.0000 mg | DELAYED_RELEASE_TABLET | Freq: Two times a day (BID) | ORAL | 0 refills | Status: DC

## 2016-07-09 NOTE — Patient Instructions (Addendum)
Stop taking the meloxicam  Begin taking diclofenac 75 mg one twice daily for pain and inflammation of chest wall  Continue the cyclobenzaprine  Stay off work for the next week and then get reassessed   Referral will be made to sports medicine for further evaluation. This may take a few days and someone should contact you when they get you an appointment.    IF you received an x-ray today, you will receive an invoice from [facility]. Please contact [facility] at [phone] with questions or concerns regarding your invoice.   IF you received labwork today, you will receive an invoice from [employer]. Please contact [facility] at [phone] with questions or concerns regarding your invoice.   Our billing staff will not be able to assist you with questions regarding bills from these companies.  You will be contacted with the lab results as soon as they are available. The fastest way to get your results is to activate your My Chart account. Instructions are located on the last page of this paperwork. If you have not heard from [place] regarding the results in 2 weeks, please contact this office.
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
## 2016-07-16 ENCOUNTER — Ambulatory Visit (INDEPENDENT_AMBULATORY_CARE_PROVIDER_SITE_OTHER): Payer: BLUE CROSS/BLUE SHIELD | Admitting: Family Medicine

## 2016-07-16 VITALS — BP 122/80 | HR 80 | Temp 98.8°F | Resp 18 | Ht 65.0 in | Wt 159.0 lb

## 2016-07-16 DIAGNOSIS — S29011D Strain of muscle and tendon of front wall of thorax, subsequent encounter: Secondary | ICD-10-CM

## 2016-07-16 DIAGNOSIS — S29091D Other injury of muscle and tendon of front wall of thorax, subsequent encounter: Secondary | ICD-10-CM | POA: Diagnosis not present

## 2016-07-16 NOTE — Progress Notes (Signed)
Patient ID: [name], male    DOB: 07/12/1986  Age: 30 y.o. MRN: [id number]  Chief Complaint  Patient presents with  . Follow-up    musce strain    Subjective:   Patient says that his shoulder and pectoralis area are getting better, but still hurts. 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 This Is Lifting Things That Are Seven-Year 80 Pounds, about 80 of Them per Laveda Normanran for about 15 Triads a Day so It Is a Constant Heavy Lifting Process. (I Don't Know Why the Computer Went to the Cycle of These Goals)  The Referral for Sports Medicine Larey SeatFell through, and Is Going to Be Reinstituted. Referrals has restarted working on this.  Current allergies, medications, problem list, past/family and social histories reviewed.  Objective:  BP 122/80 (BP Location: Right Arm, Patient Position: Sitting, Cuff Size: Small)   Pulse 80   Temp 98.8 F (37.1 C) (Oral)   Resp 18   Ht 5\' 5"  (1.651 m)   Wt 159 lb (72.1 kg)   SpO2 100%   BMI 26.46 kg/m   No major distress. Good range of motion but has pain on abduction and painful on resistance to all ranges of motion that affect the upper chest and back on the left side. Grip and strength is good.  Assessment & Plan:   Assessment: 1. Pectoralis muscle strain, subsequent encounter       Plan: Pectoralis strain, but I do not believe he can return to work yet. 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 Still wanted to see a sports medicine person for but rebuilding of this. We'll try and get him in with physical therapy also.  Orders Placed This Encounter  Procedures  . Ambulatory referral to Physical Therapy    Referral Priority:   Routine    Referral Type:   Physical Medicine    Referral Reason:   Specialty Services Required    Requested Specialty:   Physical Therapy    Number of Visits Requested:   1    No orders of the defined types were placed in this encounter.        Patient Instructions   Referral to sports  medicine is being worked on again to a different place  Referral is being made also for physical therapy to work on strengthening exercises  Continue to stay off work for another week and get reassessed.    IF you received an x-ray today, you will receive an [facility]. Please contact [facility] at [phone] with questions or concerns regarding your invoice.   IF you received labwork today, you will receive an invoice from [facility]. Please contact [facility] at [phone] with questions or concerns regarding your invoice.   Our billing staff will not be able to assist you with questions regarding bills from these companies.  You will be contacted with the lab results as soon as they are available. The fastest way to get your results is to activate your My Chart account. Instructions are located on the last page of this paperwork. If you have not heard from us regarding the results in 2 weeks, please contact this office.        Return if symptoms worsen or fail to improve.   HOPPER,DAVID, MD 07/16/2016

## 2016-07-16 NOTE — Patient Instructions (Addendum)
Referral to sports medicine is being worked on again to a different place  Referral is being made also for physical therapy to work on strengthening exercises  Continue to stay off work for another week and get reassessed.    IF you received an x-ray today, you will receive an invoice from [facility]. Please contact [facility] at [phone] with questions or concerns regarding your invoice.   IF you received labwork today, you will receive an invoice from [facility]. Please contact [facility] at [phone] with questions or concerns regarding your invoice.   Our billing staff will not be able to assist you with questions regarding bills from these companies.  You will be contacted with the lab results as soon as they are available. The fastest way to get your results is to activate your My Chart account. Instructions are located on the last page of this paperwork. If you have not heard from us regarding the results in 2 weeks, please contact this office.

## 2016-07-20 ENCOUNTER — Telehealth: Payer: Self-pay

## 2016-07-20 DIAGNOSIS — R0789 Other chest pain: Secondary | ICD-10-CM | POA: Diagnosis not present

## 2016-07-20 DIAGNOSIS — M25512 Pain in left shoulder: Secondary | ICD-10-CM | POA: Diagnosis not present

## 2016-07-20 NOTE — Telephone Encounter (Signed)
Pt needs FMLA forms completed by Dr Alwyn RenHopper for his muscle strain. I have completed what I could from the OV notes and highlighted the areas that need to be completed. I will place the forms in Dr. Ellis Parentsaub's box on 07/20/16 if someone could please return them to the FMLA/Disability box at the [employer] checkout desk within 5-7 business days. Thank you!

## 2016-07-23 NOTE — Telephone Encounter (Signed)
Paperwork scanned and faxed to [employer] on 07/23/16

## 2016-07-24 ENCOUNTER — Ambulatory Visit: Payer: BLUE CROSS/BLUE SHIELD

## 2016-07-25 ENCOUNTER — Ambulatory Visit (INDEPENDENT_AMBULATORY_CARE_PROVIDER_SITE_OTHER): Payer: BLUE CROSS/BLUE SHIELD | Admitting: Family Medicine

## 2016-07-25 ENCOUNTER — Encounter: Payer: Self-pay | Admitting: Family Medicine

## 2016-07-25 VITALS — BP 128/82 | HR 72 | Temp 98.1°F | Resp 17 | Ht 65.0 in | Wt 159.0 lb

## 2016-07-25 DIAGNOSIS — R0789 Other chest pain: Secondary | ICD-10-CM

## 2016-07-25 DIAGNOSIS — S29011A Strain of muscle and tendon of front wall of thorax, initial encounter: Secondary | ICD-10-CM

## 2016-07-25 DIAGNOSIS — S29091A Other injury of muscle and tendon of front wall of thorax, initial encounter: Secondary | ICD-10-CM

## 2016-07-25 DIAGNOSIS — M25512 Pain in left shoulder: Secondary | ICD-10-CM | POA: Diagnosis not present

## 2016-07-25 DIAGNOSIS — S29011D Strain of muscle and tendon of front wall of thorax, subsequent encounter: Secondary | ICD-10-CM

## 2016-07-25 DIAGNOSIS — S29091D Other injury of muscle and tendon of front wall of thorax, subsequent encounter: Secondary | ICD-10-CM

## 2016-07-25 MED ORDER — CYCLOBENZAPRINE HCL 5 MG PO TABS: 5.0000 mg | ORAL_TABLET | Freq: Three times a day (TID) | ORAL | 0 refills | Status: DC | PRN

## 2016-07-25 MED ORDER — DICLOFENAC SODIUM 75 MG PO TBEC: 75.0000 mg | DELAYED_RELEASE_TABLET | Freq: Two times a day (BID) | ORAL | 1 refills | Status: DC

## 2016-07-25 NOTE — Patient Instructions (Addendum)
Come in for recheck Monday 8/28 or Tuesday 8/29   IF you received an x-ray today, you will receive an invoice from [facility]. Please contact [facility] at [phone] with questions or concerns regarding your invoice.   IF you received labwork today, you will receive an invoice from [employer]. Please contact [facility] at [phone] with questions or concerns regarding your invoice.   Our billing staff will not be able to assist you with questions regarding bills from these companies.  You will be contacted with the lab results as soon as they are available. The fastest way to get your results is to activate your My Chart account. Instructions are located on the last page of this paperwork. If you have not heard from [place] regarding the results in 2 weeks, please contact this office.     Pectoralis Major Rupture With Rehab The pectoralis major is the main muscle of the chest. It is responsible for bringing the arm across the body and for rotating the arm inward. A pectoralis major rupture is a tear in the tendon that attaches the chest muscle to the upper arm bone (humerus). When the tendon is torn, there is a loss of connection between the muscle and the bone. Pectoralis major ruptures usually involve the tendon pulling off from the arm bone, although sometimes the muscle may tear in the mid-belly or at the point where the muscle becomes tendon. SYMPTOMS   A "pop" or tearing, and severe sharp, often burning, pain in the chest at the time of injury.  Tenderness, swelling, warmth, or redness and later bruising (contusion) over and around the pectoralis muscle-tendon, chest, and armpit region.  Pain and weakness when trying to use the chest muscle.  Loss of shape of the armpit region, especially when pushing your hands together in front of your body.  Loss of firm fullness, when pushing on the area where the tendon ruptured (a defect between the  ends of the tendon and bone where they separated from each other). CAUSES  The pectoralis major muscle or tendon tears when a force is placed on it that is greater than it can handle. Common causes of injury include:  Sudden episode of stressful over-activity.  Direct hit (trauma) to the chest.  Fall from a height. RISK INCREASES WITH:  Sports that require excessive muscle stress, (weightlifting).  Contact sports with minimal protective devices for the chest.  Wrestling.  Poor strength and flexibility.  Previous injury to the chest muscle.  Untreated pectoralis major tendinitis.  Corticosteroid injection into the pectoralis major tendon. (Corticosteroid injections damage tendons.)  Oral anabolic steroid use. PREVENTION  Warm up and stretch properly before activity.  Allow for adequate recovery between workouts.  Maintain physical fitness:  Strength, flexibility, and endurance.  Cardiovascular fitness. PROGNOSIS  If treated properly, pectoralis major ruptures are usually curable, with a return to sports in 6 to 9 months.  RELATED COMPLICATIONS   Weakness of the pectoralis major, especially if left untreated.  Re-rupture of the tendon after treatment.  Prolonged disability.  Risks of surgery: infection, injury to nerves (numbness, weakness, or paralysis), bleeding, hematoma (blood clot), pseudocyst (collection of fluid), shoulder stiffness, shoulder weakness, and pain with strenuous activity.  Loss of chest or armpit shape.  Inability to repair rupture. TREATMENT Treatment first involves resting from any activities that aggravate the symptoms. The use of ice and medicine will help reduce pain and inflammation. The use of strengthening and stretching exercises may help reduce pain  with activity. These exercises may be performed at home. However, referral to a therapist may be advised for further evaluation and treatment, such as ultrasound therapy. If the rupture  occurs in the muscle or the muscle-tendon juncture, surgery repair is not possible. However, for tears that occur at the attachment site of the arm bone, surgery may be advised. Without surgery, the loss of normal armpit shape and weakness of the shoulder will persist. For the best chance of a successful outcome, surgery must be performed within the first few weeks after injury. After surgery, the chest and shoulder of the affected side are restrained, to allow for healing. After restraint, it is important to perform strengthening and stretching exercises to help regain strength and a full range of motion.  MEDICATION   If pain medicine is needed, nonsteroidal anti-inflammatory medicines (aspirin and ibuprofen), or other minor pain relievers (acetaminophen), are often advised.  Do not take pain medicine for 7 days before surgery.  Prescription pain relievers may be given, if your caregiver thinks they are needed. Use only as directed and only as much as you need. COLD THERAPY  Cold treatment (icing) should be applied for 10 to 15 minutes every 2 to 3 hours for inflammation and pain, and immediately after activity that aggravates your symptoms. Use ice packs or an ice massage. SEEK MEDICAL CARE IF:  Pain increases, despite treatment.  Any of the following occur after surgery: signs of infection, including fever, increased pain, swelling, redness, drainage of fluids, or bleeding in the affected area.  New, unexplained symptoms develop. (Drugs used in treatment may produce side effects.) EXERCISES RANGE OF MOTION (ROM) AND STRETCHING EXERCISES - Pectoralis Major Rupture These exercises may help you when beginning to rehabilitate your injury. Your symptoms may resolve with or without further involvement from your physician, physical therapist or athletic trainer. While completing these exercises, remember:   Restoring tissue flexibility helps normal motion to return to the joints. This allows  healthier, less painful movement and activity.  An effective stretch should be held for at least 30 seconds.  A stretch should never be painful. You should only feel a gentle lengthening or release in the stretched tissue. ROM - Pendulum  Bend at the waist so that your right / left arm falls away from your body. Support yourself with your opposite hand on a solid surface, such as a table or a countertop.  Your right / left arm should be perpendicular to the ground. If it is not perpendicular, you need to lean over farther. Relax the muscles in your right / left arm and shoulder as much as possible.  Gently sway your hips and trunk so they move your right / left arm without any use of your right / left shoulder muscles.  Progress your movements so that your right / left arm moves side to side, then forward and backward, and finally, both clockwise and counterclockwise.  Complete __________ repetitions in each direction. Many people use this exercise to relieve discomfort in their shoulder, as well as to gain range of motion. Repeat __________ times. Complete this exercise __________ times per day. STRETCH - Flexion, Standing  Stand with good posture. With an underhand grip on your right / left and an overhand grip on the opposite hand, grasp a broomstick or cane so that your hands are a little more than shoulder width apart.  Keeping your right / left elbow straight and shoulder muscles relaxed, push the stick with your opposite hand to  raise your right / left arm in front of your body and then overhead. Raise your arm until you feel a stretch in your right / left shoulder, but before you have increased shoulder pain.  Try to avoid shrugging your right / left shoulder as your arm rises, by keeping your shoulder blade tucked down and toward your mid-back spine. Hold __________ seconds.  Slowly return to the starting position. Repeat __________ times. Complete this exercise __________ times  per day.  STRETCH - Abduction, Supine  Lie on your back. With an underhand grip on your right / left hand and an overhand grip on the opposite hand, grasp a broomstick or cane so that your hands are a little more than shoulder width apart.  Keeping your right / left elbow straight and shoulder muscles relaxed, push the stick with your opposite hand to raise your right / left arm out to the side of your body and then overhead. Raise your arm until you feel a stretch in your right / left shoulder, but before you have increased shoulder pain.  Try to avoid shrugging your right / left shoulder as your arm rises, by keeping your shoulder blade tucked down and toward your mid-back spine. Hold __________ seconds.  Slowly return to the starting position. Repeat __________ times. Complete this exercise __________ times per day.  ROM - Flexion, Active-Assisted  Lie on your back. You may bend your knees for comfort.  Grasp a broomstick or cane so your hands are about shoulder width apart. Your right / left hand should grip the end of the stick, so that your hand is positioned "thumbs-up," as if you were about to shake hands.  Using your healthy arm to lead, raise your right / left arm overhead until you feel a gentle stretch in your shoulder. Hold __________ seconds.  Use the stick to assist in returning your right / left arm to its starting position. Repeat __________ times. Complete this exercise __________ times per day.  STRETCH - External Rotation and Abductio  Stagger your stance through a doorframe. It does not matter which foot is forward.  Choose one of the following positions as instructed by your physician, physical therapist or athletic trainer. Place your hands:  and forearms above your head and on the door frame.  and forearms at head height and on the door frame.  at elbow height and on the door frame.  Keeping your head and chest upright and your stomach muscles tight to prevent  over-extending your low-back, slowly shift your weight onto your front foot until you feel a stretch across your chest and in the front of your shoulders.  Hold __________ seconds. Shift your weight to your back foot to release the stretch. Repeat __________ times. Complete this stretch __________ times per day.  STRENGTHENING EXERCISES - Pectoralis Major Rupture  These exercises may help you when beginning to rehabilitate your injury. They may resolve your symptoms with or without further involvement from your physician, physical therapist or athletic trainer. While completing these exercises, remember:   Muscles can gain both the endurance and the strength needed for everyday activities through controlled exercises.  Complete these exercises as instructed by your physician, physical therapist or athletic trainer. Increase the resistance and repetitions only as guided by your caregiver.  You may experience muscle soreness or fatigue, but the pain or discomfort you are trying to eliminate should never worsen during these exercises. If this pain does worsen, stop and make certain you are  following the directions exactly. If the pain is still present after adjustments, discontinue the exercise until you can discuss the trouble with your caregiver. STRENGTH - Scapular Protractors, Standing  Stand arms length away from a wall. Place your hands on the wall, keeping your elbows straight.  Begin by dropping your shoulder blades down and toward your mid-back spine.  To strengthen your protractors, keep your shoulder blades down, but slide them forward on your rib cage. It will feel as if you are lifting the back of your rib cage away from the wall. This is a subtle motion and can be challenging to complete. Ask your caregiver for further instruction, if you are not sure you are doing the exercise correctly.  Hold for __________ seconds. Slowly return to the starting position, resting the muscles  completely before starting the next repetition. Repeat __________ times. Complete this exercise __________ times per day. STRENGTH - Horizontal Abductors Choose one of the two positions to complete this exercise. Prone: lying on stomach:  Lie on your stomach on a firm surface so that your right / left arm overhangs the edge. Rest your forehead on your opposite forearm. With your palm facing the floor and your elbow straight, hold a __________ weight in your hand.  Squeeze your right / left shoulder blade to your mid-back spine and then slowly raise your arm to the height of the bed.  Hold for __________ seconds. Slowly reverse the directions and return to the starting position, controlling the weight as you lower your arm. Repeat __________ times. Complete this exercise __________ times per day. Standing:   Secure a rubber exercise band or tubing to a fixed object (table, pole) so that it is at the height of your shoulders when you are either standing, or sitting on a firm armless chair.  Grasp an end of the band in each hand and have your palms face each other. Straighten your elbows and lift your hands straight in front of you at shoulder height. Step back away from the secured end of band until it becomes tense.  Squeeze your shoulder blades together. Keeping your elbows locked and your hands at shoulder height, bring your hands out to your sides.  Hold __________ seconds. Slowly ease the tension on the band, as you reverse the directions and return to the starting position. Repeat __________ times. Complete this exercise __________ times per day. STRENGTH - Scapular Protractors, Quadruped  Get onto your hands and knees with your shoulders directly over your hands (or as close as you can comfortably be).  Keeping your elbows locked, lift the back of your rib cage up into your shoulder blades so your mid-back rounds out. Keep your neck muscles relaxed.  Hold this position for __________  seconds. Slowly return to the starting position and allow your muscles to relax completely before starting the next repetition. Repeat __________ times. Complete this exercise __________ times per day.  STRENGTH - Scapular Depressors  Find a sturdy chair without wheels, such as a dining table chair, and place your hands on the armrests.  Keeping your feet on the floor, lift your bottom from the seat and lock your elbows.  Keeping your elbows straight, allow gravity to pull your body weight down. Your shoulders will rise toward your ears.  Raise your body against gravity by drawing your shoulder blades down your back, shortening the distance between your shoulders and ears. Although your feet should always maintain contact with the floor, your feet should progressively support  less body weight as you get stronger.  Hold __________ seconds. In a controlled and slow manner, lower your body weight to begin the next repetition. Repeat __________ times. Complete this exercise __________ times per day.  STRENGTH - Scapular Protractors, Supine  Lie on your back on a firm surface. Extend your right / left arm straight into the air while holding a __________ weight in your hand.  Keeping your head and back in place, lift your shoulder off the floor.  Hold __________ seconds. Slowly return to the starting position and allow your muscles to relax completely before starting the next repetition. Repeat __________ times. Complete this exercise __________ times per day.   This information is not intended to replace advice given to you by your health care provider. Make sure you discuss any questions you have with your health care provider.   Document Released: 11/26/2005 Document Revised: 04/12/2015 Document Reviewed: 03/10/2009 Elsevier Interactive Patient Education [employer]2016 [employer] Inc.

## 2016-07-27 DIAGNOSIS — R0789 Other chest pain: Secondary | ICD-10-CM | POA: Diagnosis not present

## 2016-07-27 DIAGNOSIS — M25512 Pain in left shoulder: Secondary | ICD-10-CM | POA: Diagnosis not present

## 2016-08-01 DIAGNOSIS — M25512 Pain in left shoulder: Secondary | ICD-10-CM | POA: Diagnosis not present

## 2016-08-01 DIAGNOSIS — R0789 Other chest pain: Secondary | ICD-10-CM | POA: Diagnosis not present
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
## 2016-08-03 DIAGNOSIS — R0789 Other chest pain: Secondary | ICD-10-CM | POA: Diagnosis not present

## 2016-08-03 DIAGNOSIS — M25512 Pain in left shoulder: Secondary | ICD-10-CM | POA: Diagnosis not present

## 2016-08-08 ENCOUNTER — Ambulatory Visit (INDEPENDENT_AMBULATORY_CARE_PROVIDER_SITE_OTHER): Payer: BLUE CROSS/BLUE SHIELD | Admitting: Family Medicine

## 2016-08-08 ENCOUNTER — Encounter: Payer: Self-pay | Admitting: Family Medicine

## 2016-08-08 VITALS — BP 116/74 | HR 80 | Temp 98.3°F | Resp 17 | Ht 65.0 in | Wt 158.0 lb

## 2016-08-08 DIAGNOSIS — R0789 Other chest pain: Secondary | ICD-10-CM | POA: Diagnosis not present

## 2016-08-08 DIAGNOSIS — M25512 Pain in left shoulder: Secondary | ICD-10-CM | POA: Diagnosis not present

## 2016-08-08 DIAGNOSIS — S29011D Strain of muscle and tendon of front wall of thorax, subsequent encounter: Secondary | ICD-10-CM

## 2016-08-08 NOTE — Patient Instructions (Addendum)
Continue physical therapy, diclofenac twice per day as needed for discomfort, and muscle relaxant only if needed at night. I did provide a note for you to return to work if light duty is available, otherwise follow-up with us in 2 weeks. Sooner if your symptoms have improved to where you feel you can return to work without restrictions.    IF you received an x-ray today, you will receive an invoice from [facility]. Please contact [facility] at [phone] with questions or concerns regarding your invoice.   IF you received labwork today, you will receive an invoice from [facility]. Please contact [facility] at [phone] with questions or concerns regarding your invoice.   Our billing staff will not be able to assist you with questions regarding bills from these companies.  You will be contacted with the lab results as soon as they are available. The fastest way to get your results is to activate your My Chart account. Instructions are located on the last page of this paperwork. If you have not heard from us regarding the results in 2 weeks, please contact this office.

## 2016-08-08 NOTE — Progress Notes (Signed)
By signing my name below, I, Mesha Guinyard, attest that this documentation has been prepared under the direction and in the presence of Meredith Staggers.  Electronically Signed: Arvilla Market, Medical Scribe. 08/08/16. 8:25 AM.  Subjective:    Patient ID: [name], male    DOB: Dec 14, 1985, 30 y.o.   MRN: [id number]  HPI Chief Complaint  Patient presents with  . Follow-up    Pectoralis muscle strain, subsequent encounter    HPI Comments: [name] is a 30 y.o. male who presents to the [facility] for pectoralis muscle strain follow-up. He was initially seen July 6th for chest wall pain, then July 26th with left chest wall pain after picking up heavy material that morning, dx with pectoralis strain. Was treated with Flexeril, then seen on July 31st, started on Diclofenac 75 mg BID. Follow up on Aug 7th, referred to physical therapy,  continued to keep out of work and referred to sports medicine. FMLA paperwork was completed. He was improving at last visit, but still not ready to return for work. Referral to sports medicine needed to go to different provider. He has not seen ortho.   Pt states he feels pain in the front of his chest wall and radiates to the back of his chest. Pain is reproduced when he picks things up directly in front of him.Pt feels pain in the afternoon or early at night. Pt has been taking diclofenac BID, and flexeril QHS for some relief to his symptoms.Pt reports his chest is doing better, but he still hasn't returned back to work. Pt is going to therapy BID for past 4 weeks. Pt had therapy at 6am this morning and is still a little sore from it. Pt thinks physcal therapy is doing good, but some of the exercises are challenging. Pt feels like he's doing 50-60% better. Pt would like to get back to work but it hurts to pick up his 35 lbs son, and he has to pick up 60-70 lbs at work. Pt denies numbness and tingling in his  hands.   Patient Active Problem List   Diagnosis Date Noted  . Severe episode of recurrent major depressive disorder, without psychotic features (HCC)   . MDD (major depressive disorder), recurrent episode, severe (HCC) 05/30/2016  . Alcohol abuse 05/13/2016  . Cocaine abuse 05/13/2016  . 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 Coagulation disorder (HCC) 12/28/2015  . 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 Intentional acetaminophen overdose (HCC) 12/27/2015  . 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 No Known Allergies Prior to Admission medications   Medication Sig Start Date End Date Taking? Authorizing Provider  cyclobenzaprine (FLEXERIL) 5 MG tablet Take 1 tablet (5 mg total) by mouth 3 (three) times daily as needed for muscle spasms. 07/25/16  Yes Sherren Mocha, MD  diclofenac (VOLTAREN) 75 MG EC tablet Take 1 tablet (75 mg total) by mouth 2 (two) times daily. 07/25/16  Yes Sherren Mocha, MD  FLUoxetine (PROZAC) 10 MG capsule Take 3 capsules (30 mg total) by mouth daily. 06/03/16  Yes Oneta Rack, NP  lamoTRIgine (LAMICTAL) 25 MG tablet Take 2 tablets (50 mg total) by mouth every evening. 06/03/16  Yes Oneta Rack, NP   Social History   Social History  . Marital status: Married  Spouse name: N/A  . Number of children: N/A  . Years of education: N/A   Occupational History  . Not on file.   Social History Main Topics  . Smoking status: Former Smoker    Packs/day: 0.25    Types: Cigarettes    Quit date: 07/24/2012  . Smokeless tobacco: Never Used  . Alcohol use Yes     Comment: occassional  . Drug use: No  . Sexual activity: Yes   Other Topics Concern  . Not on file   Social History Narrative  . No narrative on file   [facility] 08/08/2016 07/25/2016 07/16/2016 07/09/2016 07/04/2016   Decreased Interest 0 0 0 0 0  Down, Depressed, Hopeless 0 0 0 0 0  PHQ - 2 Score 0 0 0 0 0  Altered sleeping - - - - -  Tired, decreased energy - - - - -  Change in appetite - - - - -  Feeling bad or failure about yourself  - - - - -  Trouble concentrating - - - - -  Moving slowly or fidgety/restless - - - - -  Suicidal thoughts - - - - -  PHQ-9 Score - - - - -  Difficult doing work/chores - - - - -   Review of Systems  Constitutional: Negative for diaphoresis.  Gastrointestinal: Negative for nausea and vomiting.  Musculoskeletal: Positive for myalgias.    Objective:  Physical Exam  Constitutional: He appears well-developed and well-nourished. No distress.  HENT:  Head: Normocephalic and atraumatic.  Eyes: Conjunctivae are normal.  Neck: Neck supple.  Cardiovascular: Normal rate.   Pulmonary/Chest: Effort normal.  Musculoskeletal:  c spine non tender ROM FROM c spine does not reproduce symptoms  Left shoulder FROM Full rotator cuff strength  With resisted deltoid testing has slight discomfort on the right chest wall Slight tenderness along the left pectoralis No apparent weakness with pectoralis testing, no defect [place], AC, Clavical non tender NVI distaly   Neurological: He is alert.  Skin: Skin is warm and dry.  Psychiatric: He has a normal mood and affect. His behavior is normal.  Nursing note and vitals reviewed.  BP 116/74 (BP Location: Left Arm, Patient Position: Sitting, Cuff Size: Large)   Pulse 80   Temp 98.3 F (36.8 C) (Oral)   Resp 17   Ht 5\' 5"  (1.651 m)   Wt 158 lb (71.7 kg)   SpO2 100%   BMI 26.29 kg/m   Assessment & Plan:   Matthew [name] [name] [name] is a 30 y.o. male Left-sided chest wall pain  Strain of left pectoralis muscle, subsequent encounter  Chest wall pain, pectoralis strain. Improving. No cervical spine symptoms, posterior back/chest wall nontender. Full RTC strength.  -Will continue physical therapy, diclofenac, Flexeril if  needed. Discussed return to work with restrictions, but he states that is not available. Did provide a letter that if light duty was available, 20 pound restrictions can be tried initially. Follow-up in the next 2 weeks. Sooner if worse.  No orders of the defined types were placed in this encounter.  Patient Instructions   Continue physical therapy, diclofenac twice per day as needed for discomfort, and muscle relaxant only if needed at night. I did provide a note for you to return to work if light duty is available, otherwise follow-up with us in 2 weeks. Sooner if your symptoms have improved to where you feel you can return to work without restrictions.    IF you  received an x-ray today, you will receive an invoice from [facility]. Please contact [facility] at [phone] with questions or concerns regarding your invoice.   IF you received labwork today, you will receive an invoice from [facility]. Please contact [facility] at [phone] with questions or concerns regarding your invoice.   Our billing staff will not be able to assist you with questions regarding bills from these companies.  You will be contacted with the lab results as soon as they are available. The fastest way to get your results is to activate your My Chart account. Instructions are located on the last page of this paperwork. If you have not heard from [place] regarding the results in 2 weeks, please contact this office.        I personally performed the services described in this documentation, which was scribed in my presence. The recorded information has been reviewed and considered, and addended by me as needed.   Signed,   Meredith Staggers, MD [facility].  08/08/16 8:41 AM

## 2016-08-10 DIAGNOSIS — R0789 Other chest pain: Secondary | ICD-10-CM | POA: Diagnosis not present

## 2016-08-10 DIAGNOSIS — M25512 Pain in left shoulder: Secondary | ICD-10-CM | POA: Diagnosis not present

## 2016-08-17 DIAGNOSIS — R0789 Other chest pain: Secondary | ICD-10-CM | POA: Diagnosis not present

## 2016-08-17 DIAGNOSIS — M25512 Pain in left shoulder: Secondary | ICD-10-CM | POA: Diagnosis not present

## 2016-08-22 ENCOUNTER — Ambulatory Visit (INDEPENDENT_AMBULATORY_CARE_PROVIDER_SITE_OTHER): Payer: BLUE CROSS/BLUE SHIELD | Admitting: Family Medicine

## 2016-08-22 VITALS — BP 128/82 | HR 72 | Temp 98.2°F | Resp 17 | Ht 65.5 in | Wt 162.0 lb

## 2016-08-22 DIAGNOSIS — R079 Chest pain, unspecified: Secondary | ICD-10-CM

## 2016-08-22 MED ORDER — CYCLOBENZAPRINE HCL 10 MG PO TABS: 10.0000 mg | ORAL_TABLET | Freq: Three times a day (TID) | ORAL | 0 refills | Status: DC | PRN

## 2016-08-22 NOTE — Patient Instructions (Addendum)
It was good to meet you today.   We have increased your flexeril to 10 mg.  This may make you sleepy.  Do not drive after this.  We will refer you to sports medicine.  They will call you to set up an appointment.      IF you received an x-ray today, you will receive an invoice from [facility]. Please contact [facility] at [phone] with questions or concerns regarding your invoice.   IF you received labwork today, you will receive an invoice from [facility]. Please contact [facility] at [phone] with questions or concerns regarding your invoice.   Our billing staff will not be able to assist you with questions regarding bills from these companies.  You will be contacted with the lab results as soon as they are available. The fastest way to get your results is to activate your My Chart account. Instructions are located on the last page of this paperwork. If you have not heard from us regarding the results in 2 weeks, please contact this office.

## 2016-08-22 NOTE — Progress Notes (Signed)
   [name] is a 30 y.o. male who presents to [facility] today for Left shoulder pain:  1.  Left shoulder pain: Present for about 3 months.  Started about suicide attempt in June.  States he doesn't know what cuased the pain, no initial injury or trauma.  Describes as sharp, stabbing pain in Left pectoralis region.  Worse when pushing or pulling.  No cough or dyspnea.  No exertional CP.  No N/V.  No abdominal pain.  No real shoulder pain.  Pain is worse when lifting arm above head.  No swelling or redness.   ROS as above.   PMH reviewed. Patient is a nonsmoker.   Past Medical History:  Diagnosis Date  . 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  Medications reviewed. Current Outpatient Prescriptions  Medication Sig Dispense Refill  . cyclobenzaprine (FLEXERIL) 5 MG tablet Take 1 tablet (5 mg total) by mouth 3 (three) times daily as needed for muscle spasms. 90 tablet 0  . diclofenac (VOLTAREN) 75 MG EC tablet Take 1 tablet (75 mg total) by mouth 2 (two) times daily. 60 tablet 1  . FLUoxetine (PROZAC) 10 MG capsule Take 3 capsules (30 mg total) by mouth daily. 30 capsule 0  . lamoTRIgine (LAMICTAL) 25 MG tablet Take 2 tablets (50 mg total) by mouth every evening. 30 tablet 0   No current facility-administered medications for this visit.      Physical Exam:  BP 128/82 (BP Location: Right Arm, Patient Position: Sitting, Cuff Size: Normal)   Pulse 72   Temp 98.2 F (36.8 C) (Oral)   Resp 17   Ht 5' 5.5" (1.664 m)   Wt 162 lb (73.5 kg)   SpO2 100%   BMI 26.55 kg/m  Gen:  Alert, cooperative patient who appears stated age in no acute distress.  Vital signs reviewed. MSK: Right shoulder within normal limits. Does have some noted atrophy of left deltoid muscle. No redness or swelling/effusion of left shoulder. No pain with lateral extension or for extension to 90. She  does have pain above the level of his head. Tenderness only through the left pectoral muscle from midclavicular line lateral.  No spasm noted.  No nodule swelling Skin:  No bruising.  Assessment and Plan:  1.  Left musculoskeletal chest pain: -He is being treated with diclofenac and Flexeril. He is also undergoing physical therapy. He should continue this. States he's not see much improvement with physical therapy. -States the Flexeril helps some but not much. -Increasing this to 10 mg. -He has been having trouble with this for 3 months. -Referring sports medicine today for potential ultrasound to see if there is chronic tear. May need long-term nitroglycerin patch. Patient interested in having specialist second opinion. -Referral place today. -Follow-up with us 1 week after he is seen at sports medicine.

## 2016-08-23 DIAGNOSIS — R0789 Other chest pain: Secondary | ICD-10-CM | POA: Diagnosis not present

## 2016-08-23 DIAGNOSIS — M25512 Pain in left shoulder: Secondary | ICD-10-CM | POA: Diagnosis not present

## 2016-08-27 DIAGNOSIS — M25512 Pain in left shoulder: Secondary | ICD-10-CM | POA: Diagnosis not present

## 2016-08-27 DIAGNOSIS — R0789 Other chest pain: Secondary | ICD-10-CM | POA: Diagnosis not present

## 2016-09-06 ENCOUNTER — Ambulatory Visit (INDEPENDENT_AMBULATORY_CARE_PROVIDER_SITE_OTHER): Payer: BLUE CROSS/BLUE SHIELD | Admitting: Family Medicine

## 2016-09-06 VITALS — BP 104/62 | HR 75 | Temp 98.3°F | Resp 17 | Ht 65.5 in | Wt 156.0 lb

## 2016-09-06 DIAGNOSIS — S29011D Strain of muscle and tendon of front wall of thorax, subsequent encounter: Secondary | ICD-10-CM

## 2016-09-06 NOTE — Patient Instructions (Addendum)
Keep doing the physical therapy exercises both with them and at home.    I'll check on the sports medicine referral.  If you feel like it's not getting better or worse after work, keep that appointment. If it continues to better, you can cancel it.    I have written you a letter until next Thursday.      IF you received an x-ray today, you will receive an invoice from [facility]. Please contact [facility] at [phone] with questions or concerns regarding your invoice.   IF you received labwork today, you will receive an invoice from [facility]. Please contact [facility] at [phone] with questions or concerns regarding your invoice.   Our billing staff will not be able to assist you with questions regarding bills from these companies.  You will be contacted with the lab results as soon as they are available. The fastest way to get your results is to activate your My Chart account. Instructions are located on the last page of this paperwork. If you have not heard from us regarding the results in 2 weeks, please contact this office.

## 2016-09-06 NOTE — Progress Notes (Signed)
   [name] is a 30 y.o. male who presents to [facility] today for FU for Left pectoralis pain:  1.  Left pectoral muscle pain: Please see prior office visit notes for full details.  As noted previously no initial injury or trauma. States that since he was last seen here sharp stabbing pain has greatly reduced. He is undergoing physical therapy. States this is helps. He is doing stretching and weight lifting with physical therapy. He does have some mild pain in his chest after physical therapy relieved with rest and occasional Flexeril. He is not having to take anything else for pain relief. He like to know when he should go back to work. 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 8 hours a day 5 days a week.  No shoulder pain. No exertional chest pain. No nausea vomiting. No abdominal pain. No redness or swelling.  ROS as above.    PMH reviewed. Patient is a nonsmoker.   Past Medical History:  Diagnosis Date  . 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  Medications reviewed. Current Outpatient Prescriptions  Medication Sig Dispense Refill  . cyclobenzaprine (FLEXERIL) 10 MG tablet Take 1 tablet (10 mg total) by mouth 3 (three) times daily as needed for muscle spasms. 90 tablet 0  . diclofenac (VOLTAREN) 75 MG EC tablet Take 1 tablet (75 mg total) by mouth 2 (two) times daily. 60 tablet 1  . FLUoxetine (PROZAC) 10 MG capsule Take 3 capsules (30 mg total) by mouth daily. 30 capsule 0  . lamoTRIgine (LAMICTAL) 25 MG tablet Take 2 tablets (50 mg total) by mouth every evening. 30 tablet 0   No current facility-administered medications for this visit.      Physical Exam:  BP 104/62 (BP Location: Right Arm, Patient Position: Sitting, Cuff Size: Normal)   Pulse 75   Temp 98.3 F (36.8 C) (Oral)   Resp 17   Ht 5' 5.5" (1.664 m)   Wt 156 lb (70.8 kg)    SpO2 98%   BMI 25.56 kg/m  Gen:  Alert, cooperative patient who appears stated age in no acute distress.  Vital signs reviewed. MSK:  No redness or swelling/effusion of left pectoral muscle. Nontender to my examination, palpation of his chest throughout.  Strength is 5 out of 5 upper extremity opposition, abduction, abduction against resistance. No tenderness with this. Skin: He does have tattoos around left nipple. Also tattoos scattered bilateral forearms.  Assessment and Plan:  1.  Left pectoral muscle strain: -He is healing well. -He is concerned that the pain will return after he starts back at work. Evidently he has court next week and will be able to start back work on Thursday which is one week from today.  -Somehow the referral fell through for sports medicine. -He should continue with physical therapy. I will put in the referral for sports medicine mostly for comfort for patient. He would like to have this in place in case the pain returns after he starts back at work for further evaluation/recommendations.  - I have written him a note for work.

## 2016-09-06 NOTE — Addendum Note (Signed)
Addended byGwendolyn Grant: Jakai Onofre H on: 09/06/2016 11:21 AM   Modules accepted: Orders

## 2018-02-13 ENCOUNTER — Other Ambulatory Visit: Payer: Self-pay

## 2018-02-13 ENCOUNTER — Ambulatory Visit (INDEPENDENT_AMBULATORY_CARE_PROVIDER_SITE_OTHER): Payer: BLUE CROSS/BLUE SHIELD | Admitting: Emergency Medicine

## 2018-02-13 ENCOUNTER — Encounter: Payer: Self-pay | Admitting: Emergency Medicine

## 2018-02-13 VITALS — BP 118/82 | HR 79 | Temp 98.3°F | Resp 16 | Ht 64.5 in | Wt 180.6 lb

## 2018-02-13 DIAGNOSIS — R0981 Nasal congestion: Secondary | ICD-10-CM | POA: Diagnosis not present

## 2018-02-13 DIAGNOSIS — J3489 Other specified disorders of nose and nasal sinuses: Secondary | ICD-10-CM | POA: Diagnosis not present

## 2018-02-13 DIAGNOSIS — R41840 Attention and concentration deficit: Secondary | ICD-10-CM | POA: Diagnosis not present

## 2018-02-13 DIAGNOSIS — J029 Acute pharyngitis, unspecified: Secondary | ICD-10-CM | POA: Insufficient documentation

## 2018-02-13 DIAGNOSIS — J069 Acute upper respiratory infection, unspecified: Secondary | ICD-10-CM

## 2018-02-13 MED ORDER — TRIAMCINOLONE ACETONIDE 55 MCG/ACT NA AERO: 2.0000 | INHALATION_SPRAY | Freq: Every day | NASAL | 12 refills | Status: DC

## 2018-02-13 MED ORDER — PSEUDOEPHEDRINE-GUAIFENESIN ER 60-600 MG PO TB12: 1.0000 | ORAL_TABLET | Freq: Two times a day (BID) | ORAL | 1 refills | Status: AC

## 2018-02-13 MED ORDER — AZITHROMYCIN 250 MG PO TABS: ORAL_TABLET | ORAL | 0 refills | Status: DC

## 2018-02-13 NOTE — Patient Instructions (Addendum)
     IF you received an x-ray today, you will receive an invoice from [facility]. Please contact [facility] at [phone] with questions or concerns regarding your invoice.   IF you received labwork today, you will receive an invoice from [facility]. Please contact [facility] at [phone] with questions or concerns regarding your invoice.   Our billing staff will not be able to assist you with questions regarding bills from these companies.  You will be contacted with the lab results as soon as they are available. The fastest way to get your results is to activate your My Chart account. Instructions are located on the last page of this paperwork. If you have not heard from us regarding the results in 2 weeks, please contact this office.     Upper Respiratory Infection, Adult Most upper respiratory infections (URIs) are caused by a virus. A URI affects the nose, throat, and upper air passages. The most common type of URI is often called "the common cold." Follow these instructions at home:  Take medicines only as told by your doctor.  Gargle warm saltwater or take cough drops to comfort your throat as told by your doctor.  Use a warm mist humidifier or inhale steam from a shower to increase air moisture. This may make it easier to breathe.  Drink enough fluid to keep your pee (urine) clear or pale yellow.  Eat soups and other clear broths.  Have a healthy diet.  Rest as needed.  Go back to work when your fever is gone or your doctor says it is okay. ? You may need to stay home longer to avoid giving your URI to others. ? You can also wear a face mask and wash your hands often to prevent spread of the virus.  Use your inhaler more if you have asthma.  Do not use any tobacco products, including cigarettes, chewing tobacco, or electronic cigarettes. If you need help quitting, ask your doctor. Contact a doctor if:  You are getting worse, not better.  Your  symptoms are not helped by medicine.  You have chills.  You are getting more short of breath.  You have brown or red mucus.  You have yellow or brown discharge from your nose.  You have pain in your face, especially when you bend forward.  You have a fever.  You have puffy (swollen) neck glands.  You have pain while swallowing.  You have white areas in the back of your throat. Get help right away if:  You have very bad or constant: ? Headache. ? Ear pain. ? Pain in your forehead, behind your eyes, and over your cheekbones (sinus pain). ? Chest pain.  You have long-lasting (chronic) lung disease and any of the following: ? Wheezing. ? Long-lasting cough. ? Coughing up blood. ? A change in your usual mucus.  You have a stiff neck.  You have changes in your: ? Vision. ? Hearing. ? Thinking. ? Mood. This information is not intended to replace advice given to you by your health care provider. Make sure you discuss any questions you have with your health care provider. Document Released: 05/14/2008 Document Revised: 07/29/2016 Document Reviewed: 03/03/2014 Elsevier Interactive Patient Education  2018 [employer]  

## 2018-02-13 NOTE — Progress Notes (Signed)
[name] 32 y.o.   Chief Complaint  Patient presents with  . Nasal Congestion    since yesterday  . Sore Throat    HISTORY OF PRESENT ILLNESS: This is a 32 y.o. male complaining of runny nose, nasal congestion, cough, generalized achiness and malaise that started yesterday at noon time.  URI   This is a new problem. The current episode started yesterday. The problem has been gradually worsening. There has been no fever. Associated symptoms include congestion, coughing, rhinorrhea and a sore throat. Pertinent negatives include no abdominal pain, chest pain, diarrhea, dysuria, ear pain, headaches, joint pain, joint swelling, nausea, neck pain, rash, sinus pain, vomiting or wheezing. He has tried nothing for the symptoms.   Patient also complaining of chronic attention problems where he is unable to fully focus, pay attention, or multitask.  I recommend that he sees an attention disorder specialist.  Agreeable to it.  Prior to Admission medications   Medication Sig Start Date End Date Taking? Authorizing Provider  cyclobenzaprine (FLEXERIL) 10 MG tablet Take 1 tablet (10 mg total) by mouth 3 (three) times daily as needed for muscle spasms. Patient not taking: Reported on 02/13/2018 08/22/16   Tobey Grim, MD  diclofenac (VOLTAREN) 75 MG EC tablet Take 1 tablet (75 mg total) by mouth 2 (two) times daily. Patient not taking: Reported on 02/13/2018 07/25/16   Sherren Mocha, MD  FLUoxetine (PROZAC) 10 MG capsule Take 3 capsules (30 mg total) by mouth daily. Patient not taking: Reported on 02/13/2018 06/03/16   Oneta Rack, NP  lamoTRIgine (LAMICTAL) 25 MG tablet Take 2 tablets (50 mg total) by mouth every evening. Patient not taking: Reported on 02/13/2018 06/03/16   Oneta Rack, NP    No Known Allergies  Patient Active Problem List   Diagnosis Date Noted  . Severe episode of recurrent major depressive disorder, without psychotic features (HCC)   . MDD (major depressive  disorder), recurrent episode, severe (HCC) 05/30/2016  . Alcohol abuse 05/13/2016  . Cocaine abuse (HCC) 05/13/2016  . 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 Coagulation disorder (HCC) 12/28/2015  . 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 Intentional acetaminophen overdose (HCC) 12/27/2015  . 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  Social History   Socioeconomic History  . Marital status: Married    Spouse name: Not on file  . Number of children: Not on file  . Years of education: Not on file  . Highest education level: Not on file  Social Needs  . Financial resource strain: Not on file  . Food insecurity - worry: Not on file  . Food insecurity - inability: Not on file  . Transportation needs - medical: Not on file  . Transportation needs - non-medical: Not on file  Occupational History  . Not on file  Tobacco Use  . Smoking status: Former Smoker    Packs/day: 0.25    Types: Cigarettes    Last attempt to quit: 07/24/2012    Years since quitting: 5.5  . Smokeless tobacco: Never Used  Substance and Sexual Activity  . Alcohol use: Yes    Comment: occassional  . Drug use: No  . Sexual activity: Yes  Other Topics Concern  . Not on file  Social History Narrative  . Not on file  Family History  Problem Relation Age of Onset  . Hypertension Mother      Review of Systems  Constitutional: Positive for malaise/fatigue. Negative for chills and fever.  HENT: Positive for congestion, rhinorrhea and sore throat. Negative for ear pain and sinus pain.   Eyes: Negative.  Negative for discharge and redness.  Respiratory: Positive for cough. Negative for shortness of breath and wheezing.   Cardiovascular: Negative.  Negative for chest pain.  Gastrointestinal: Negative.  Negative for abdominal pain,  blood in stool, diarrhea, melena, nausea and vomiting.  Genitourinary: Negative.  Negative for dysuria and hematuria.  Musculoskeletal: Positive for myalgias. Negative for joint pain and neck pain.  Skin: Negative for rash.  Neurological: Positive for weakness. Negative for dizziness, sensory change, focal weakness and headaches.  Endo/Heme/Allergies: Negative.   All other systems reviewed and are negative.   Vitals:   02/13/18 1734  BP: 118/82  Pulse: 79  Resp: 16  Temp: 98.3 F (36.8 C)  SpO2: 98%    Physical Exam  Constitutional: He is oriented to person, place, and time. He appears well-developed and well-nourished.  HENT:  Head: Normocephalic and atraumatic.  Right Ear: External ear normal.  Left Ear: External ear normal.  Mouth/Throat: Uvula is midline. Posterior oropharyngeal erythema present. No oropharyngeal exudate, posterior oropharyngeal edema or tonsillar abscesses.  Eyes: Conjunctivae and EOM are normal. Pupils are equal, round, and reactive to light.  Neck: Normal range of motion. Neck supple. No JVD present.  Cardiovascular: Normal rate, regular rhythm and normal heart sounds.  Pulmonary/Chest: Effort normal and breath sounds normal.  Abdominal: Soft. Bowel sounds are normal. He exhibits no distension. There is no tenderness.  Musculoskeletal: Normal range of motion.  Lymphadenopathy:    He has no cervical adenopathy.  Neurological: He is alert and oriented to person, place, and time. No sensory deficit. He exhibits normal muscle tone.  Skin: Skin is warm and dry. Capillary refill takes less than 2 seconds. No rash noted.  Psychiatric: He has a normal mood and affect. His behavior is normal.  Vitals reviewed.    ASSESSMENT & PLAN: [name] was seen today for nasal congestion and sore throat.  Diagnoses and all orders for this visit:  Acute upper respiratory infection  Rhinorrhea  Sinus congestion -     pseudoephedrine-guaifenesin (MUCINEX D) 60-600 MG  12 hr tablet; Take 1 tablet by mouth every 12 (twelve) hours for 5 days. -     triamcinolone (NASACORT) 55 MCG/ACT AERO nasal inhaler; Place 2 sprays into the nose daily.  Sore throat -     azithromycin (ZITHROMAX) 250 MG tablet; Sig as indicated  Attention deficit Comments: Suspected Orders: -     Ambulatory referral to Psychiatry     Patient Instructions       IF you received an x-ray today, you will receive an invoice from [facility]. Please contact [facility] at [phone] with questions or concerns regarding your invoice.   IF you received labwork today, you will receive an invoice from [facility]. Please contact [facility] at [phone] with questions or concerns regarding your invoice.   Our billing staff will not be able to assist you with questions regarding bills from these companies.  You will be contacted with the lab results as soon as they are available. The fastest way to get your results is to activate your My Chart account. Instructions are located on the last page of this paperwork. If you have not heard from us regarding the results in  2 weeks, please contact this office.     Upper Respiratory Infection, Adult Most upper respiratory infections (URIs) are caused by a virus. A URI affects the nose, throat, and upper air passages. The most common type of URI is often called "the common cold." Follow these instructions at home:  Take medicines only as told by your doctor.  Gargle warm saltwater or take cough drops to comfort your throat as told by your doctor.  Use a warm mist humidifier or inhale steam from a shower to increase air moisture. This may make it easier to breathe.  Drink enough fluid to keep your pee (urine) clear or pale yellow.  Eat soups and other clear broths.  Have a healthy diet.  Rest as needed.  Go back to work when your fever is gone or your doctor says it is okay. ? You may need to stay home longer to avoid  giving your URI to others. ? You can also wear a face mask and wash your hands often to prevent spread of the virus.  Use your inhaler more if you have asthma.  Do not use any tobacco products, including cigarettes, chewing tobacco, or electronic cigarettes. If you need help quitting, ask your doctor. Contact a doctor if:  You are getting worse, not better.  Your symptoms are not helped by medicine.  You have chills.  You are getting more short of breath.  You have brown or red mucus.  You have yellow or brown discharge from your nose.  You have pain in your face, especially when you bend forward.  You have a fever.  You have puffy (swollen) neck glands.  You have pain while swallowing.  You have white areas in the back of your throat. Get help right away if:  You have very bad or constant: ? Headache. ? Ear pain. ? Pain in your forehead, behind your eyes, and over your cheekbones (sinus pain). ? Chest pain.  You have long-lasting (chronic) lung disease and any of the following: ? Wheezing. ? Long-lasting cough. ? Coughing up blood. ? A change in your usual mucus.  You have a stiff neck.  You have changes in your: ? Vision. ? Hearing. ? Thinking. ? Mood. This information is not intended to replace advice given to you by your health care provider. Make sure you discuss any questions you have with your health care provider. Document Released: 05/14/2008 Document Revised: 07/29/2016 Document Reviewed: 03/03/2014 Elsevier Interactive Patient Education  2018 Elsevier Inc.      Edwina Barth, MD [facility]

## 2018-03-31 ENCOUNTER — Ambulatory Visit (HOSPITAL_COMMUNITY): Payer: BLUE CROSS/BLUE SHIELD | Admitting: Psychiatry

## 2018-04-02 ENCOUNTER — Encounter (HOSPITAL_COMMUNITY): Payer: Self-pay

## 2018-04-02 ENCOUNTER — Encounter (HOSPITAL_COMMUNITY): Payer: Self-pay | Admitting: Psychiatry

## 2018-04-02 ENCOUNTER — Ambulatory Visit (INDEPENDENT_AMBULATORY_CARE_PROVIDER_SITE_OTHER): Payer: BLUE CROSS/BLUE SHIELD | Admitting: Psychiatry

## 2018-04-02 VITALS — BP 118/68 | HR 66 | Ht 66.0 in | Wt 179.0 lb

## 2018-04-02 DIAGNOSIS — Z915 Personal history of self-harm: Secondary | ICD-10-CM | POA: Diagnosis not present

## 2018-04-02 DIAGNOSIS — F908 Attention-deficit hyperactivity disorder, other type: Secondary | ICD-10-CM

## 2018-04-02 DIAGNOSIS — Z87891 Personal history of nicotine dependence: Secondary | ICD-10-CM

## 2018-04-02 DIAGNOSIS — F1011 Alcohol abuse, in remission: Secondary | ICD-10-CM | POA: Diagnosis not present

## 2018-04-02 DIAGNOSIS — F1411 Cocaine abuse, in remission: Secondary | ICD-10-CM | POA: Diagnosis not present

## 2018-04-02 DIAGNOSIS — F3342 Major depressive disorder, recurrent, in full remission: Secondary | ICD-10-CM | POA: Diagnosis not present

## 2018-04-02 NOTE — Progress Notes (Signed)
Psychiatric Initial Adult Assessment   Patient Identification: Ivette [name] [name] Mosqueda MRN:  [id number] Date of Evaluation:  04/02/2018 Referral Source: Primary care physician. Chief Complaint:  I do not know why I am here.  Visit Diagnosis: No diagnosis found.  History of Present Illness: Patient is 32 year old [place] employed, married man who was referred from [facility].  Patient is not sure why he is here.  He did not complete outpatient assessment form.  He denies any depression, irritability, anger, mania, psychosis, hallucination, anxiety or any irritability.  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  He also noticed difficulty multitasking.  He is upset because his primary care physician to schedule appointment but he does not know why.  I called [facility] urgent [facility] and talk to the nurse who mentioned that referral accidentally went to behavioral health center.  She mentioned it supposed to go to [facility] for psychological testing.  Patient appears very frustrated and upset because he has to take a day off for this appointment.  Patient has a history of psychiatric inpatient treatment June 2017 because of depression and suicidal attempt.  Patient vaguely remember about that admission.  I reviewed notes from the epic and apparently he tried to lay down in front of the car after an argument with his girlfriend then.  Also had a DUI and patient told the charges are dropped now.  He was discharged to see a therapist and follow-up with urgent care.  He was discharged on Lamictal and Prozac but patient discontinued these medication after few months.  Patient told he is sleeping good.  He is very happy in his marital life.  He lives with his wife and 3 kids.  Since the DUI he stopped using drugs and alcohol.  He denies any aggressive behavior, crying spells, self  abusive behavior.  He denies PTSD, OCD, phobia or any panic attacks.  He endorses difficulty in attention concentration and multitasking started few months ago.  He finished high school and he did not have any problem in the school.  His grades are average.  His appetite is okay.    Past Psychiatric History: Admitted twice for psychiatric inpatient treatment.  [facility] after taking overdose on Tylenol and in June 2017 when he tried to lie down in front of vehicle after an argument with his girlfriend then.  He was discharged on Prozac and Lamictal which he discontinued after few months.  Patient also had a history of DUI.  Previous Psychotropic Medications: Yes   Substance Abuse History in the last 12 months:  No.  Consequences of Substance Abuse: Patient has history of drinking alcohol and using cocaine.  He had a DUI in 2017 but charges dropped.  He claims to be sober since then.  Past Medical History:  Past Medical History:  Diagnosis Date  . 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 No pertinent surgical history.  Family Psychiatric History: Reviewed.    Family History:  Family History  Problem Relation Age of Onset  . Hypertension Mother     Social History:   Social History   Socioeconomic History  . Marital status: Married    Spouse name: Lacy-LakeviewRosa  . Number of children: 3  . Years of education: Not on file  .  Highest education level: High school graduate  Occupational History  . Not on file  Social Needs  . Financial resource strain: Not hard at all  . Food insecurity:    Worry: Never true    Inability: Never true  . Transportation needs:    Medical: No    Non-medical: No  Tobacco Use  . Smoking status: Former Smoker    Packs/day: 0.25    Types: Cigarettes    Last attempt to quit: 07/24/2012    Years since quitting: 5.6  . Smokeless tobacco: Never Used  Substance and  Sexual Activity  . Alcohol use: Never    Frequency: Never  . Drug use: No  . Sexual activity: Yes  Lifestyle  . Physical activity:    Days per week: 0 days    Minutes per session: 0 min  . Stress: To some extent  Relationships  . Social connections:    Talks on phone: More than three times a week    Gets together: Three times a week    Attends religious service: 1 to 4 times per year    Active member of club or organization: No    Attends meetings of clubs or organizations: Never    Relationship status: Married  Other Topics Concern  . Not on file  Social History Narrative  . Not on file    Additional Social History: Patient born in [place].  He grew up in [place].  He is married and he has 3 children who are 93 year old, 71-year-old and 21-month-old.  Patient lives with his wife and he mentioned relationship is doing very well.  Allergies:  No Known Allergies  Metabolic Disorder Labs: No results found for: HGBA1C, MPG No results found for: PROLACTIN Lab Results  Component Value Date   CHOL 113 (L) 12/06/2015   TRIG 95 12/06/2015   HDL 36 (L) 12/06/2015   CHOLHDL 3.1 12/06/2015   VLDL 19 12/06/2015   LDLCALC 58 12/06/2015   LDLCALC 75 09/23/2012     Current Medications: No current outpatient medications on file.   No current facility-administered medications for this visit.     Neurologic: Headache: No Seizure: No Paresthesias:No  Musculoskeletal: Strength & Muscle Tone: within normal limits Gait & Station: normal Patient leans: N/A  Psychiatric Specialty Exam: ROS  Blood pressure 118/68, pulse 66, height 5\' 6"  (1.676 m), weight 179 lb (81.2 kg).Body mass index is 28.89 kg/m.  General Appearance: Casual and Upset and frustrated with appointment  Eye Contact:  Good  Speech:  Slow  Volume:  Normal  Mood:  Euthymic  Affect:  Congruent  Thought Process:  Goal Directed  Orientation:  Full (Time, Place, and Person)  Thought Content:  Logical   Suicidal Thoughts:  No  Homicidal Thoughts:  No  Memory:  Immediate;   Fair Recent;   Fair Remote;   Fair  Judgement:  Good  Insight:  Good  Psychomotor Activity:  Normal  Concentration:  Concentration: Fair and Attention Span: Fair  Recall:  [employer] of Knowledge:Fair  Language: Fair  Akathisia:  No  Handed:  Right  AIMS (if indicated):  0  Assets:  Desire for Improvement Housing Physical Health Resilience Social Support Talents/Skills Transportation  ADL's:  Intact  Cognition: WNL  Sleep: Good.   Assessment: Major depressive disorder in remission.  Cocaine use in remission.  Alcohol use in remission.  Rule out attention deficit disorder.  Plan: I review his discharge summary and current medication.  At this  time patient is not taking any antidepressant and does not exhibit symptoms of depression.  His concern is decreased attention and focus and difficulty multitasking.  Patient will need psychological testing and I have provided names where he can have psychological testing.  His primary care physician also trying to get appointment at [facility] attention specialist.  I spoke to [facility] urgent care staff and spoke to nurse Amy who mention referral to see psychiatrist was in error and have gone to [place] attention specialist for cyclical testing.  I discussed with the patient about his issues and recommended if he started to feel depressed and anxious then he can call us to schedule appointment.  Once he has a psychological testing he can see a physician for follow-up.  I also discussed safety concerns at any time having active suicidal thoughts or homicidal without any need to call 911 or go to local emergency room.     Cleotis Nipper, MD 4/24/201911:41 AM

## 2018-04-06 IMAGING — DX DG RIBS W/ CHEST 3+V*L*
4 series · 4 of 4 positions shown · non-contrast
Comparison: Chest x-ray of 12/06/2015

CLINICAL DATA: Left rib pain after heavy lifting

EXAM:
LEFT RIBS AND CHEST - 3+ VIEW

[chest pa]
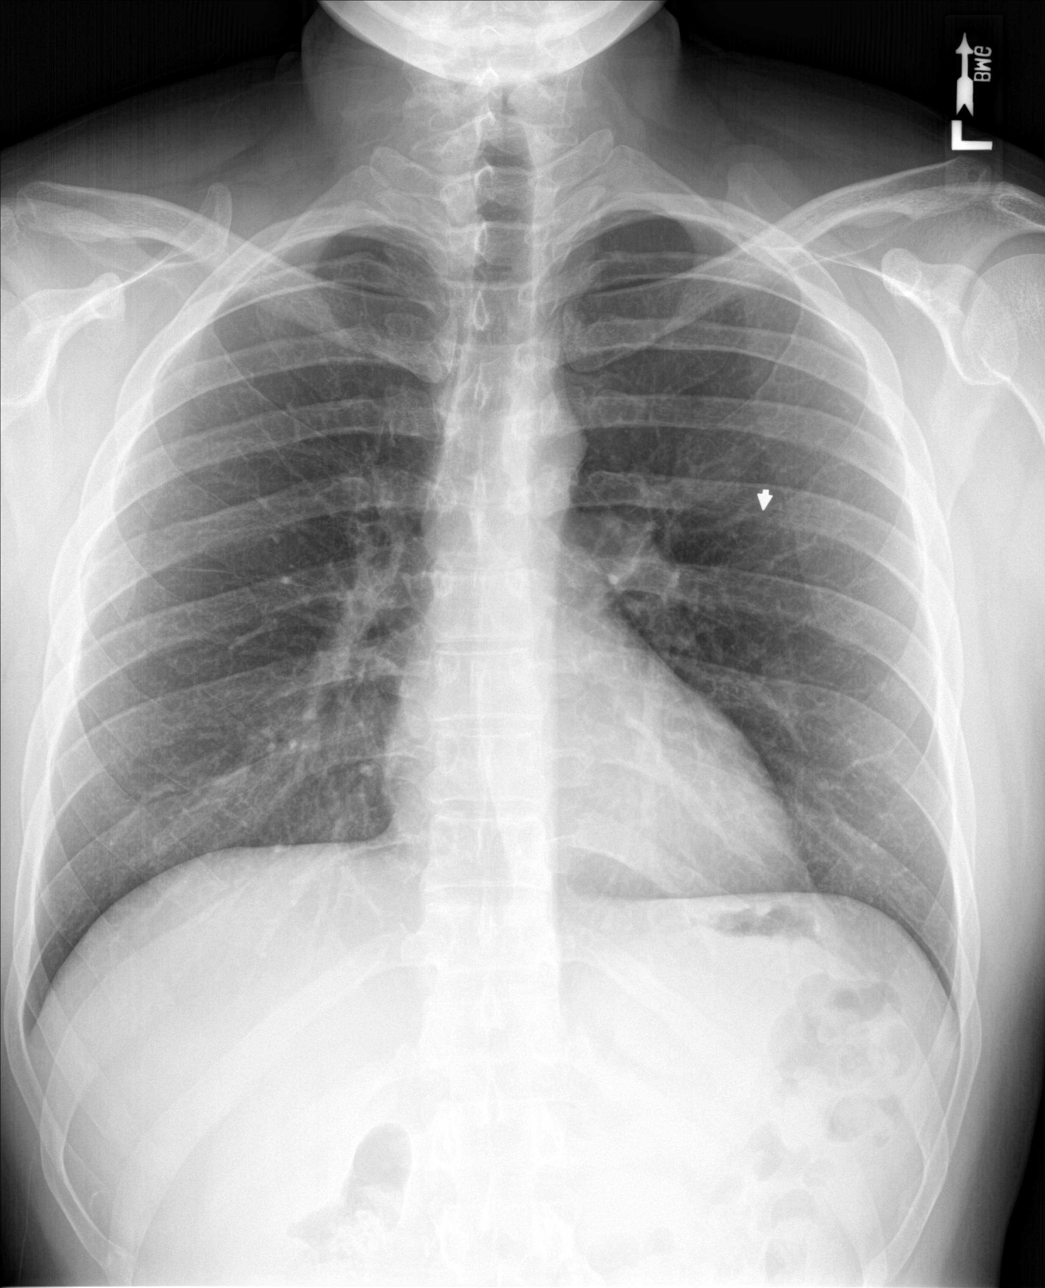

[rib pa]
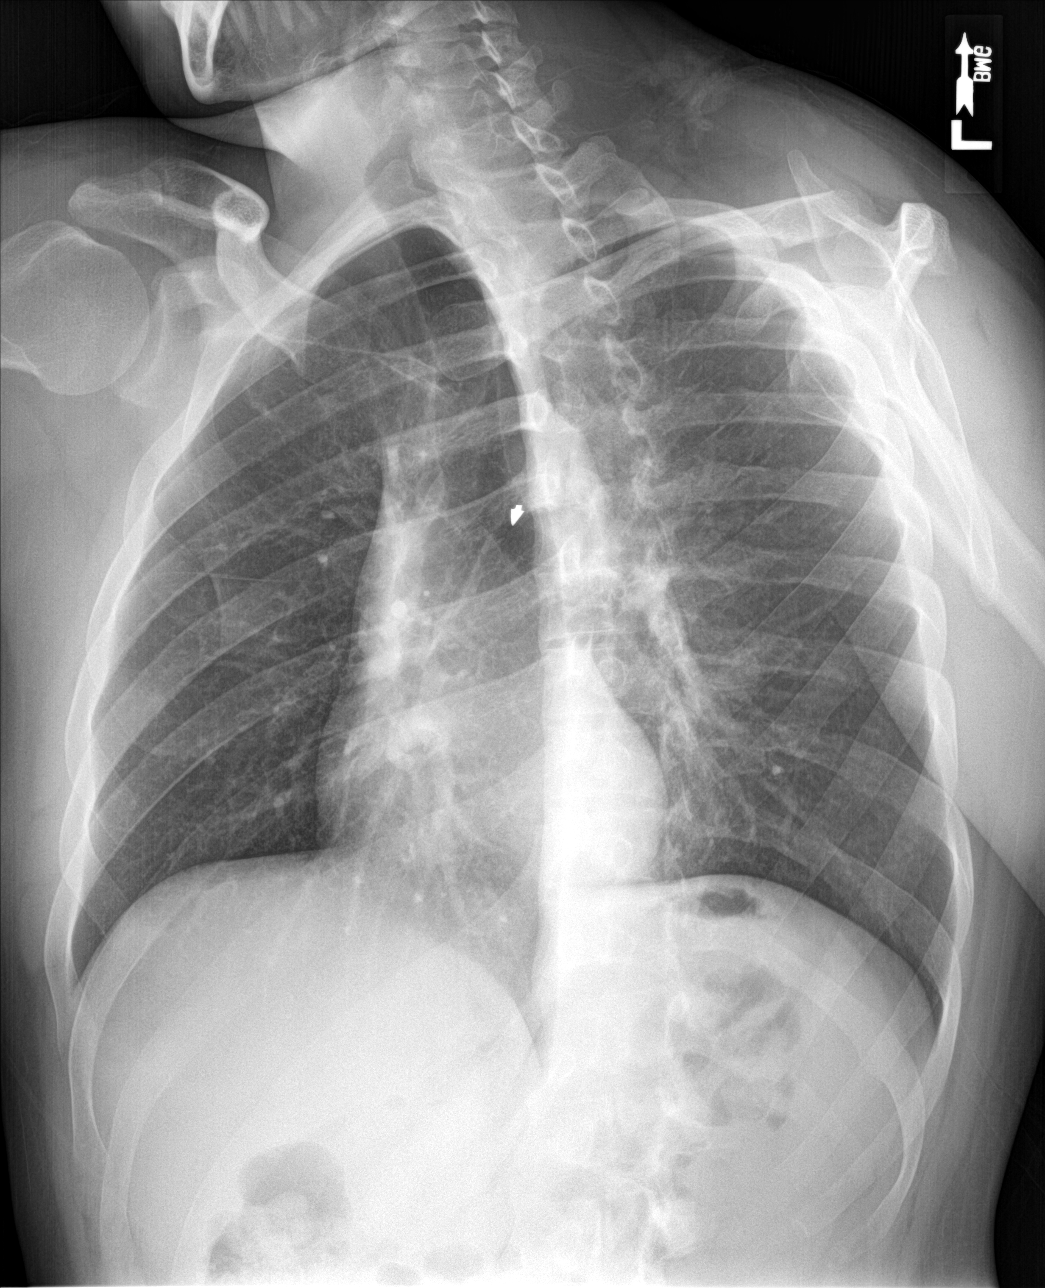

[rib obl (1 of 2)]
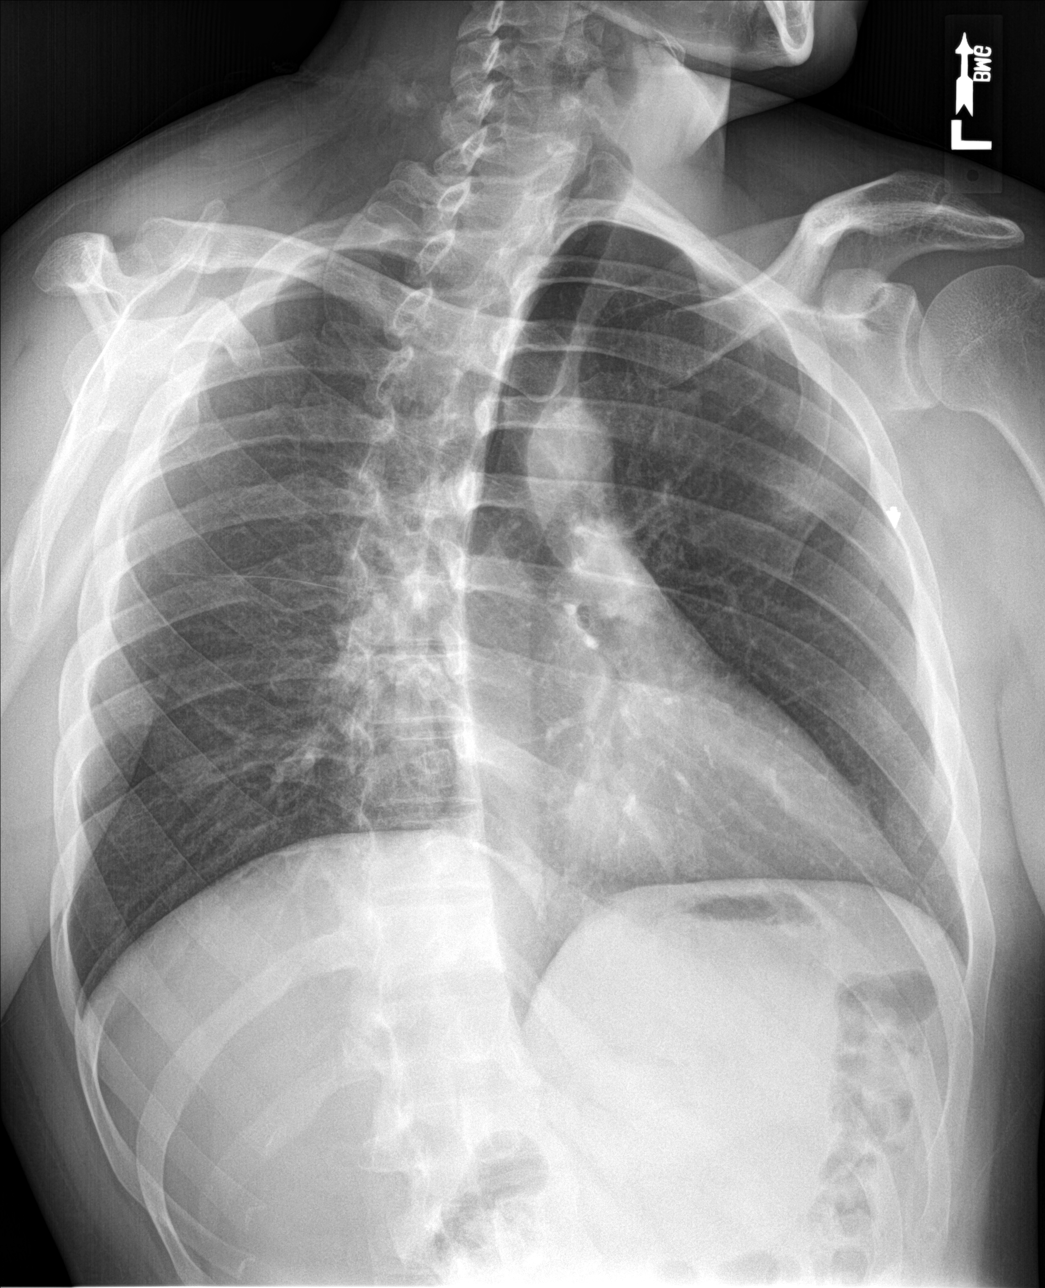

[rib obl (2 of 2)]
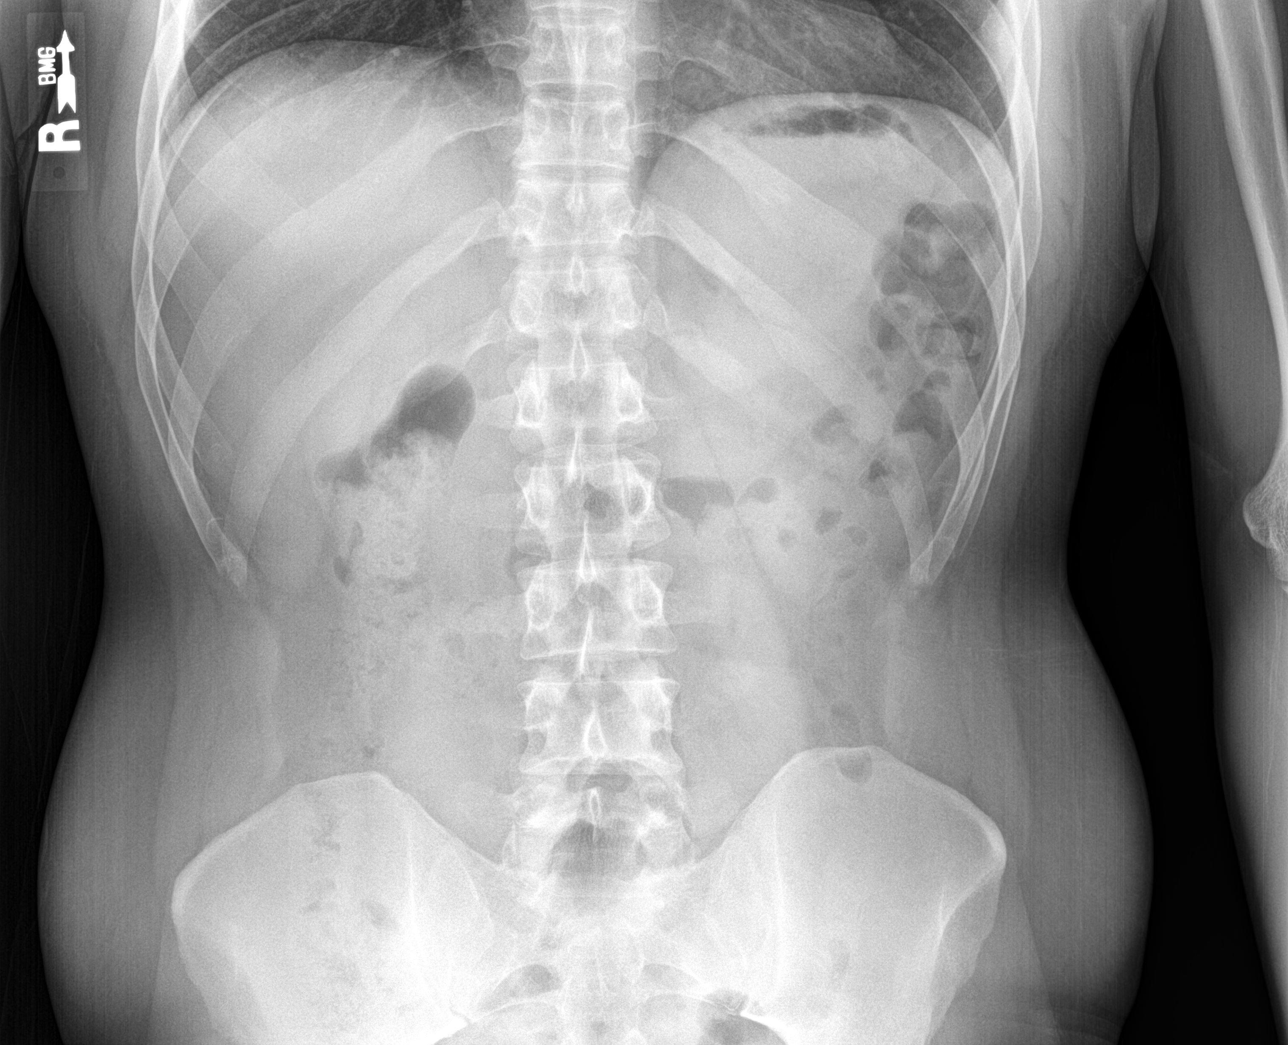

[4 of 4 positions shown; findings below may reference images not displayed]

FINDINGS: No active infiltrate or effusion is seen. Mediastinal and hilar
contours are unremarkable. The heart is within normal limits in
size. No bony abnormality is seen. Left rib detail films were
obtained with a small metallic marker over the area of pain. No
underlying rib fracture is seen.
IMPRESSION: 1. No active lung disease.
2. Negative left rib detail.

## 2018-04-08 ENCOUNTER — Ambulatory Visit: Payer: BLUE CROSS/BLUE SHIELD | Admitting: Emergency Medicine

## 2019-06-19 DIAGNOSIS — Z20828 Contact with and (suspected) exposure to other viral communicable diseases: Secondary | ICD-10-CM | POA: Diagnosis not present

## 2019-08-17 DIAGNOSIS — H52223 Regular astigmatism, bilateral: Secondary | ICD-10-CM | POA: Diagnosis not present

## 2020-02-07 ENCOUNTER — Other Ambulatory Visit: Payer: Self-pay

## 2020-02-07 ENCOUNTER — Ambulatory Visit (HOSPITAL_COMMUNITY)
Admission: EM | Admit: 2020-02-07 | Discharge: 2020-02-07 | Disposition: A | Discharge status: Home / Self Care | Payer: BC Managed Care – PPO | Attending: Urgent Care | Admitting: Urgent Care

## 2020-02-07 ENCOUNTER — Encounter (HOSPITAL_COMMUNITY): Payer: Self-pay | Admitting: Emergency Medicine

## 2020-02-07 DIAGNOSIS — R070 Pain in throat: Secondary | ICD-10-CM | POA: Diagnosis not present

## 2020-02-07 DIAGNOSIS — L989 Disorder of the skin and subcutaneous tissue, unspecified: Secondary | ICD-10-CM | POA: Insufficient documentation

## 2020-02-07 MED ORDER — NAPROXEN 500 MG PO TABS: 500.0000 mg | ORAL_TABLET | Freq: Two times a day (BID) | ORAL | 0 refills | Status: DC

## 2020-02-07 NOTE — ED Triage Notes (Signed)
Patient has a dark bump or skin tag on anterior neck.  Patient denies this being there before.  Patient doesn't know of any injury.  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

## 2020-02-07 NOTE — Discharge Instructions (Signed)

## 2020-02-07 NOTE — ED Provider Notes (Signed)
[name]   MRN: [id number] DOB: 03-Jul-1986  Subjective:   [name] is a 34 y.o. male presenting for 1 day history of painful skin lesion over right lower side of his neck.  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  He would like to have the lesion removed as it is painful.  Denies taking chronic medications.  No Known Allergies  Past Medical History:  Diagnosis Date  . 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  Family History  Problem Relation Age of Onset  . Hypertension Mother     Social History   Tobacco Use  . Smoking status: Former Smoker    Packs/day: 0.25    Types: Cigarettes    Quit date: 07/24/2012    Years since quitting: 7.5  . Smokeless tobacco: Never Used  Substance Use Topics  . Alcohol use: Never  . Drug use: No    ROS   Objective:   Vitals: BP (!) 142/92 (BP Location: Right Arm)   Pulse 75   Temp 98.1 F (36.7 C) (Oral)   Resp 16   SpO2 98%   Physical Exam Constitutional:      General: He is not in acute distress.    Appearance: Normal appearance. He is well-developed and normal weight. He is not ill-appearing, toxic-appearing or diaphoretic.  HENT:     Head: Normocephalic and atraumatic.     Right Ear: External ear normal.     Left Ear: External ear normal.     Nose: Nose normal.     Mouth/Throat:     Pharynx: Oropharynx is clear.  Eyes:     General: No scleral icterus.       Right eye: No discharge.        Left eye: No discharge.     Extraocular Movements: Extraocular movements intact.     Pupils: Pupils are equal, round, and reactive to light.  Neck:   Cardiovascular:     Rate and Rhythm: Normal rate.  Pulmonary:     Effort: Pulmonary effort is normal.  Musculoskeletal:     Cervical back: Normal range of motion.  Skin:    General: Skin  is warm and dry.  Neurological:     Mental Status: He is alert and oriented to person, place, and time.  Psychiatric:        Mood and Affect: Mood normal.        Behavior: Behavior normal.        Thought Content: Thought content normal.        Judgment: Judgment normal.    PROCEDURE NOTE: sebaceous cyst excision Verbal consent obtained. Local anesthesia with 2cc of 2% lidocaine with epinephrine. Sterile prep and drape. An elliptical incision was made extending 1cm using iris scissors. Wound closed with #1 5-0 Prolene simple interrupted sutures. Cleansed and dressed.   Assessment and Plan :   1. Throat pain   2. Skin lesion     Use naproxen for pain and inflammation.  Counseled patient that this is a possible inflammatory acrochordron, nevus or malignancy.  Biopsy pending. Wound care reviewed, rtc in 7 days for suture removal.  Encouraged patient to sign up for my chart to review results.  Counseled patient on potential for adverse effects with medications prescribed/recommended today, ER and return-to-clinic precautions discussed,  patient verbalized understanding.    Wallis Bamberg, [place] 02/07/20 1157

## 2020-02-09 ENCOUNTER — Telehealth (HOSPITAL_COMMUNITY): Payer: Self-pay

## 2020-02-10 LAB — SURGICAL PATHOLOGY

## 2020-02-14 ENCOUNTER — Other Ambulatory Visit: Payer: Self-pay

## 2020-02-14 ENCOUNTER — Ambulatory Visit (HOSPITAL_COMMUNITY)
Admission: EM | Admit: 2020-02-14 | Discharge: 2020-02-14 | Disposition: A | Discharge status: Home / Self Care | Payer: BC Managed Care – PPO

## 2020-02-14 ENCOUNTER — Encounter (HOSPITAL_COMMUNITY): Payer: Self-pay | Admitting: Emergency Medicine

## 2020-02-14 DIAGNOSIS — L918 Other hypertrophic disorders of the skin: Secondary | ICD-10-CM | POA: Diagnosis not present

## 2020-02-14 NOTE — ED Provider Notes (Signed)
[facility]    CSN: [id number] Arrival date & time: 02/14/20  1004      History   Chief Complaint Chief Complaint  Patient presents with  . Suture / Staple Removal  . Follow-up    HPI [name] is a 34 y.o. male.   HPI Patient presents today for suture removal.  Patient grew concerned as his pathology results were released to my chart and he requested that a provider explain results from his recent pathology that resulted from the skin lesion that he had removed on 02/07/2020.  Past Medical History:  Diagnosis Date  . 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 Rhinorrhea 02/13/2018  . Sinus congestion 02/13/2018  . Sore throat 02/13/2018  . Attention deficit 02/13/2018  . Severe episode of recurrent major depressive disorder, without psychotic features (HCC)   . MDD (major depressive disorder), recurrent episode, severe (HCC) 05/30/2016  . Alcohol abuse 05/13/2016  . Cocaine abuse (HCC) 05/13/2016  . Coagulation disorder (HCC) 12/28/2015  . 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 No pertinent surgical history.     Home Medications    Prior to Admission medications   Medication Sig Start Date End Date Taking? Authorizing Provider  naproxen (NAPROSYN) 500 MG tablet Take 1 tablet (500 mg total) by mouth 2 (two) times daily. 02/07/20   Wallis Bamberg, PA-C    Family History Family History  Problem Relation Age of Onset  . Hypertension Mother     Social History Social History   Tobacco Use  . Smoking status: Former Smoker    Packs/day: 0.25    Types: Cigarettes    Quit date: 07/24/2012    Years since quitting: 7.5  . Smokeless tobacco: Never Used  Substance Use Topics  . Alcohol use: Never  . Drug use: No     Allergies   Patient has no known allergies.  Review of  Systems Review of Systems Pertinent negatives listed in HPI  Physical Exam Triage Vital Signs ED Triage Vitals  Enc Vitals Group     BP 02/14/20 1028 130/88     Pulse Rate 02/14/20 1028 61     Resp 02/14/20 1028 16     Temp 02/14/20 1028 98.4 F (36.9 C)     Temp Source 02/14/20 1028 Oral     SpO2 02/14/20 1028 97 %     Weight --      Height --      Head Circumference --      Peak Flow --      Pain Score 02/14/20 1033 0     Pain Loc --      Pain Edu? --      Excl. in GC? --    No data found.  Updated Vital Signs BP 130/88 (BP Location: Left Arm)   Pulse 61   Temp 98.4 F (36.9 C) (Oral)   Resp 16   SpO2 97%   Visual Acuity Right Eye Distance:   Left Eye Distance:   Bilateral Distance:    Right Eye Near:   Left Eye Near:    Bilateral Near:     Physical Exam General appearance: alert, well developed, well nourished, cooperative and in no distress Head: Normocephalic, without obvious abnormality, atraumatic Respiratory: Respirations even and unlabored, normal respiratory rate Heart: rate and rhythm  normal. No gallop or murmurs noted on exam  Extremities: Healed intact biopsy site involving the right neck. Skin: Skin color, texture, turgor normal. No rashes seen  Psych: Appropriate mood and affect. Neurologic: Alert, oriented to person, place, and time, thought content appropriate.  UC Treatments / Results  Labs (all labs ordered are listed, but only abnormal results are displayed) Labs Reviewed - No data to display   Pathology Results  FINAL MICROSCOPIC DIAGNOSIS:   A.  SKIN, RIGHT NECK, BIOPSY:  ACROCHORDON, INFARTED   COMMENT: The specimen demonstrates a pedunculated lesion composed of a  squamous lining and a fibrovascular core. The epidermis demonstrates  early necrosis and there is stromal hemorrhage, consistent with  infarction.   EKG   Radiology No results found.  Procedures Procedures (including critical care time)  Medications  Ordered in UC Medications - No data to display  Initial Impression / Assessment and Plan / UC Course  I have reviewed the triage vital signs and the nursing notes.  Pertinent labs & imaging results that were available during my care of the patient were reviewed by me and considered in my medical decision making (see chart for details).   Patient provided benign results of the skin tag that was removed from his neck.  He has other skin tags which are intact on his neck today and are nonpainful and nonerythemic.  Sutured site involving the neck completely healed. No evidence of infection.  Patient also provided information to follow-up with [facility] dermatology as needed if any additional skin concerns may arise. Patient did verbalize understanding and agreement with plan. Final Clinical Impressions(s) / UC Diagnoses   Final diagnoses:  Achrochordon     Discharge Instructions     Results from recent skin lesion removal are benign.  Feel free to follow-up with dermatology if additional lesions erupt for further evaluation.    ED Prescriptions    None     PDMP not reviewed this encounter.   Scot Jun, [place] 02/14/20 [phone]

## 2020-02-14 NOTE — ED Triage Notes (Signed)
Pt here for suture removal with one suture removed from his neck; pt has questions about pathology and to see provider

## 2020-02-14 NOTE — Discharge Instructions (Addendum)
Results from recent skin lesion removal are benign.  Feel free to follow-up with dermatology if additional lesions erupt for further evaluation.

## 2021-04-29 DIAGNOSIS — H04123 Dry eye syndrome of bilateral lacrimal glands: Secondary | ICD-10-CM | POA: Diagnosis not present

## 2021-04-29 DIAGNOSIS — H40033 Anatomical narrow angle, bilateral: Secondary | ICD-10-CM | POA: Diagnosis not present

## 2021-04-30 ENCOUNTER — Ambulatory Visit (HOSPITAL_COMMUNITY)
Admission: EM | Admit: 2021-04-30 | Discharge: 2021-04-30 | Disposition: A | Discharge status: Home / Self Care | Payer: BC Managed Care – PPO | Attending: Family Medicine | Admitting: Family Medicine

## 2021-04-30 ENCOUNTER — Encounter (HOSPITAL_COMMUNITY): Payer: Self-pay

## 2021-04-30 ENCOUNTER — Other Ambulatory Visit: Payer: Self-pay

## 2021-04-30 DIAGNOSIS — J3089 Other allergic rhinitis: Secondary | ICD-10-CM | POA: Diagnosis not present

## 2021-04-30 DIAGNOSIS — H1013 Acute atopic conjunctivitis, bilateral: Secondary | ICD-10-CM | POA: Diagnosis not present

## 2021-04-30 MED ORDER — FLUTICASONE PROPIONATE 50 MCG/ACT NA SUSP: 1.0000 | Freq: Two times a day (BID) | NASAL | 2 refills | Status: DC

## 2021-04-30 MED ORDER — AZELASTINE HCL 0.05 % OP SOLN: 1.0000 [drp] | Freq: Two times a day (BID) | OPHTHALMIC | 2 refills | Status: AC

## 2021-04-30 MED ORDER — MONTELUKAST SODIUM 10 MG PO TABS: 10.0000 mg | ORAL_TABLET | Freq: Every day | ORAL | 2 refills | Status: DC

## 2021-04-30 NOTE — ED Triage Notes (Signed)
Pt reports runny nose and watery eyes since the weather started getting hot. States is worse when he is working outdoors.

## 2021-04-30 NOTE — ED Provider Notes (Signed)
[facility]    CSN: [id number] Arrival date & time: 04/30/21  1353      History   Chief Complaint Chief Complaint  Patient presents with  . Nasal Congestion    HPI [name] is a 35 y.o. male.   Patient presenting today with several week history of acute on chronic sinus congestion and pressure, runny nose, postnasal drainage, scratchy throat, itchy watery eyes.  History of seasonal allergy issues, taking Xyzal daily without much relief.  Denies fever, chills, cough, chest pain, shortness of breath, abdominal pain, nausea vomiting or diarrhea.  No new sick contacts.     Past Medical History:  Diagnosis Date  . 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 Rhinorrhea 02/13/2018  . Sinus congestion 02/13/2018  . Sore throat 02/13/2018  . Attention deficit 02/13/2018  . Severe episode of recurrent major depressive disorder, without psychotic features (HCC)   . MDD (major depressive disorder), recurrent episode, severe (HCC) 05/30/2016  . Alcohol abuse 05/13/2016  . Cocaine abuse (HCC) 05/13/2016  . Coagulation disorder (HCC) 12/28/2015  . 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 No pertinent surgical history.     Home Medications    Prior to Admission medications   Medication Sig Start Date End Date Taking? Authorizing Provider  azelastine (OPTIVAR) 0.05 % ophthalmic solution Place 1 drop into both eyes 2 (two) times daily. 04/30/21  Yes Particia Nearing, PA-C  fluticasone [facility]) 50 MCG/ACT nasal spray Place 1 spray into both nostrils in the morning and at bedtime. 04/30/21  Yes Particia Nearing, PA-C  montelukast (SINGULAIR) 10 MG tablet Take 1 tablet (10 mg total) by mouth at bedtime. 04/30/21  Yes Particia Nearing, PA-C  naproxen (NAPROSYN) 500 MG tablet Take 1 tablet  (500 mg total) by mouth 2 (two) times daily. 02/07/20   Wallis Bamberg, PA-C    Family History Family History  Problem Relation Age of Onset  . Hypertension Mother     Social History Social History   Tobacco Use  . Smoking status: Former Smoker    Packs/day: 0.25    Types: Cigarettes    Quit date: 07/24/2012    Years since quitting: 8.7  . Smokeless tobacco: Never Used  Vaping Use  . Vaping Use: Never used  Substance Use Topics  . Alcohol use: Never  . Drug use: No     Allergies   Patient has no known allergies.   Review of Systems Review of Systems Per HPI  Physical Exam Triage Vital Signs ED Triage Vitals  Enc Vitals Group     BP 04/30/21 1506 124/85     Pulse Rate 04/30/21 1506 61     Resp 04/30/21 1506 16     Temp 04/30/21 1506 97.7 F (36.5 C)     Temp Source 04/30/21 1506 Oral     SpO2 04/30/21 1506 96 %     Weight --      Height --      Head Circumference --      Peak Flow --      Pain Score 04/30/21 1505 0     Pain Loc --      Pain Edu? --      Excl. in GC? --    No data found.  Updated Vital Signs BP 124/85 (BP  Location: Right Arm)   Pulse 61   Temp 97.7 F (36.5 C) (Oral)   Resp 16   SpO2 96%   Visual Acuity Right Eye Distance:   Left Eye Distance:   Bilateral Distance:    Right Eye Near:   Left Eye Near:    Bilateral Near:     Physical Exam Vitals and nursing note reviewed.  Constitutional:      Appearance: Normal appearance.  HENT:     Head: Atraumatic.     Right Ear: Tympanic membrane normal.     Left Ear: Tympanic membrane normal.     Nose: Rhinorrhea present.     Mouth/Throat:     Mouth: Mucous membranes are moist.     Pharynx: Posterior oropharyngeal erythema present. No oropharyngeal exudate.  Eyes:     Extraocular Movements: Extraocular movements intact.     Comments: Bilateral conjunctive a mildly injected  Cardiovascular:     Rate and Rhythm: Normal rate and regular rhythm.  Pulmonary:     Effort: Pulmonary  effort is normal.     Breath sounds: Normal breath sounds.  Musculoskeletal:        General: Normal range of motion.     Cervical back: Normal range of motion and neck supple.  Skin:    General: Skin is warm and dry.  Neurological:     General: No focal deficit present.     Mental Status: He is oriented to person, place, and time.  Psychiatric:        Mood and Affect: Mood normal.        Thought Content: Thought content normal.        Judgment: Judgment normal.    UC Treatments / Results  Labs (all labs ordered are listed, but only abnormal results are displayed) Labs Reviewed - No data to display  EKG   Radiology No results found.  Procedures Procedures (including critical care time)  Medications Ordered in UC Medications - No data to display  Initial Impression / Assessment and Plan / UC Course  I have reviewed the triage vital signs and the nursing notes.  Pertinent labs & imaging results that were available during my care of the patient were reviewed by me and considered in my medical decision making (see chart for details).     Consistent with poorly controlled seasonal allergies, will add Singulair, Optivar drops, Flonase twice daily to his nasal regimen.  Follow-up with primary care for recheck symptoms and discuss allergy referral if still not under good control.  Final Clinical Impressions(s) / UC Diagnoses   Final diagnoses:  Seasonal allergic rhinitis due to other allergic trigger  Allergic conjunctivitis of both eyes   Discharge Instructions   None    ED Prescriptions    Medication Sig Dispense Auth. Provider   montelukast (SINGULAIR) 10 MG tablet Take 1 tablet (10 mg total) by mouth at bedtime. 30 tablet Particia Nearing, PA-C   fluticasone [facility]) 50 MCG/ACT nasal spray Place 1 spray into both nostrils in the morning and at bedtime. 16 g Particia Nearing, [place]   azelastine (OPTIVAR) 0.05 % ophthalmic solution Place 1 drop into both  eyes 2 (two) times daily. 6 mL Particia Nearing, [place]     PDMP not reviewed this encounter.   Particia Nearing, [place] 04/30/21 1609

## 2021-10-20 ENCOUNTER — Ambulatory Visit (HOSPITAL_COMMUNITY)
Admission: EM | Admit: 2021-10-20 | Discharge: 2021-10-20 | Disposition: A | Discharge status: Home / Self Care | Payer: BC Managed Care – PPO | Attending: Urgent Care | Admitting: Urgent Care

## 2021-10-20 ENCOUNTER — Other Ambulatory Visit: Payer: Self-pay

## 2021-10-20 ENCOUNTER — Ambulatory Visit (INDEPENDENT_AMBULATORY_CARE_PROVIDER_SITE_OTHER): Payer: BC Managed Care – PPO

## 2021-10-20 ENCOUNTER — Encounter (HOSPITAL_COMMUNITY): Payer: Self-pay

## 2021-10-20 DIAGNOSIS — M546 Pain in thoracic spine: Secondary | ICD-10-CM

## 2021-10-20 DIAGNOSIS — M542 Cervicalgia: Secondary | ICD-10-CM

## 2021-10-20 DIAGNOSIS — S161XXA Strain of muscle, fascia and tendon at neck level, initial encounter: Secondary | ICD-10-CM

## 2021-10-20 MED ORDER — TIZANIDINE HCL 4 MG PO TABS: 4.0000 mg | ORAL_TABLET | Freq: Every day | ORAL | 0 refills | Status: DC

## 2021-10-20 MED ORDER — NAPROXEN 500 MG PO TABS: 500.0000 mg | ORAL_TABLET | Freq: Two times a day (BID) | ORAL | 0 refills | Status: DC
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
## 2022-04-20 ENCOUNTER — Ambulatory Visit (HOSPITAL_COMMUNITY)
Admission: EM | Admit: 2022-04-20 | Discharge: 2022-04-20 | Disposition: A | Discharge status: Home / Self Care | Payer: BC Managed Care – PPO | Attending: Internal Medicine | Admitting: Internal Medicine

## 2022-04-20 ENCOUNTER — Encounter (HOSPITAL_COMMUNITY): Payer: Self-pay

## 2022-04-20 DIAGNOSIS — G43109 Migraine with aura, not intractable, without status migrainosus: Secondary | ICD-10-CM | POA: Diagnosis not present

## 2022-04-20 MED ORDER — SUMATRIPTAN SUCCINATE 50 MG PO TABS: 50.0000 mg | ORAL_TABLET | Freq: Once | ORAL | 0 refills | Status: DC

## 2022-04-20 NOTE — Discharge Instructions (Signed)
Increase oral fluid intake ?Take medications as prescribed ?Please monitor your blood pressure at home-measure your blood pressure about 2-3 times a week and keep a log of it. ?Return to urgent care if you have worsening symptoms ?By over-the-counter sleep aid to help with your sleep.  Good sleep practices will help with the headaches. ?

## 2022-04-20 NOTE — ED Triage Notes (Addendum)
Pt concern he has htn d/t blurred vision 2wks ago then again on Tuesday with lightheadedness. C/o bad headaches at times. Pt denies any sx's right now.  ?

## 2022-04-21 ENCOUNTER — Other Ambulatory Visit: Payer: Self-pay | Admitting: Family Medicine

## 2022-04-24 NOTE — Telephone Encounter (Signed)
Requested Prescriptions  ?Pending Prescriptions Disp Refills  ?? montelukast (SINGULAIR) 10 MG tablet [Pharmacy Med Name: Montelukast Sodium 10 MG Oral Tablet] 30 tablet 0  ?  Sig: TAKE 1 TABLET BY MOUTH AT BEDTIME  ?  ? There is no refill protocol information for this order  ?  ? ?

## 2022-04-24 NOTE — ED Provider Notes (Signed)
?[facility] ? ? ? ?CSN: [id number] ?Arrival date & time: 04/20/22  1813 ? ? ?  ? ?History   ?Chief Complaint ?Chief Complaint  ?Patient presents with  ? Hypertension  ? ? ?HPI ?[name] is a 36 y.o. male comes to the urgent care with 2-week history of intermittent headache, transient blurry vision and non-colored spots in his visual fields.  Symptom onset is sudden and usually while he is working.  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  He has nausea with these episodes but no vomiting.  Headache is occipital at times.  He denies any relieving factors.  On a couple of occasions drinking caffeinated drinks has helped the headaches.  He has a remote history of migraines.  He denies any alcohol use on a regular basis.  Patient endorses poor sleep hygiene because of his work schedule.  ? ?HPI ? ?Past Medical History:  ?Diagnosis Date  ? Cysticercosis   ? 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 ? ?Patient Active Problem List  ? Diagnosis Date Noted  ? Rhinorrhea 02/13/2018  ? Sinus congestion 02/13/2018  ? Sore throat 02/13/2018  ? Attention deficit 02/13/2018  ? Severe episode of recurrent major depressive disorder, without psychotic features ([place])   ? MDD (major depressive disorder), recurrent episode, severe ([place]) 05/30/2016  ? Alcohol abuse 05/13/2016  ? Cocaine abuse ([place]) 05/13/2016  ? Coagulation disorder ([place]) 12/28/2015  ? Fulminant hepatic failure ([place]) 12/28/2015  ? 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 ? ?History reviewed. No pertinent surgical history. ? ? ? ? ?Home Medications   ? ?Prior to Admission medications   ?Medication Sig Start Date End Date Taking? Authorizing Provider  ?SUMAtriptan (IMITREX) 50 MG tablet Take 1 tablet (50 mg total) by mouth once for 1 dose. May repeat in 2 hours if headache persists or recurs. 04/20/22 04/20/22 Yes Jakavion Bilodeau, Myrene Galas, MD  ?azelastine (OPTIVAR) 0.05 % ophthalmic solution Place 1  drop into both eyes 2 (two) times daily. 04/30/21   Volney American, PA-C  ?fluticasone (FLONASE) 50 MCG/ACT nasal spray Place 1 spray into both nostrils in the morning and at bedtime. 04/30/21   Volney American, PA-C  ?montelukast (SINGULAIR) 10 MG tablet Take 1 tablet (10 mg total) by mouth at bedtime. 04/30/21   Volney American, PA-C  ?tiZANidine (ZANAFLEX) 4 MG tablet Take 1 tablet (4 mg total) by mouth at bedtime. 10/20/21   Jaynee Eagles, PA-C  ? ? ?Family History ?Family History  ?Problem Relation Age of Onset  ? Hypertension Mother   ? ? ?Social History ?Social History  ? ?Tobacco Use  ? Smoking status: Former  ?  Packs/day: 0.25  ?  Types: Cigarettes  ?  Quit date: 07/24/2012  ?  Years since quitting: 9.7  ? Smokeless tobacco: Never  ?Vaping Use  ? Vaping Use: Never used  ?Substance Use Topics  ? Alcohol use: Never  ? Drug use: No  ? ? ? ?Allergies   ?Patient has no known allergies. ? ? ?Review of Systems ?Review of Systems  ?Respiratory: Negative.    ?Cardiovascular: Negative.   ?Gastrointestinal: Negative.   ?Genitourinary: Negative.   ?Neurological:  Positive for headaches.  ? ? ?Physical Exam ?Triage Vital Signs ?ED Triage Vitals  ?Enc Vitals Group  ?   BP 04/20/22 1909 (!) 143/100  ?   Pulse Rate 04/20/22 1909 75  ?   Resp 04/20/22 1909 18  ?  Temp 04/20/22 1909 98.1 ?F (36.7 ?C)  ?   Temp Source 04/20/22 1909 Oral  ?   SpO2 04/20/22 1909 97 %  ?   Weight --   ?   Height --   ?   Head Circumference --   ?   Peak Flow --   ?   Pain Score 04/20/22 1910 0  ?   Pain Loc --   ?   Pain Edu? --   ?   Excl. in [place]? --   ? ?No data found. ? ?Updated Vital Signs ?BP (!) 143/100 (BP Location: Left Arm)   Pulse 75   Temp 98.1 ?F (36.7 ?C) (Oral)   Resp 18   SpO2 97%  ? ?Visual Acuity ?Right Eye Distance:   ?Left Eye Distance:   ?Bilateral Distance:   ? ?Right Eye Near:   ?Left Eye Near:    ?Bilateral Near:    ? ?Physical Exam ?Vitals and nursing note reviewed.  ?Constitutional:   ?   General: He  is not in acute distress. ?   Appearance: Normal appearance. He is not ill-appearing.  ?HENT:  ?   Mouth/Throat:  ?   Pharynx: No oropharyngeal exudate or posterior oropharyngeal erythema.  ?Cardiovascular:  ?   Rate and Rhythm: Normal rate and regular rhythm.  ?Pulmonary:  ?   Effort: Pulmonary effort is normal.  ?   Breath sounds: Normal breath sounds.  ?Abdominal:  ?   General: Bowel sounds are normal.  ?   Palpations: Abdomen is soft.  ?Musculoskeletal:     ?   General: Normal range of motion.  ?Neurological:  ?   General: No focal deficit present.  ?   Mental Status: He is alert and oriented to person, place, and time.  ?   Cranial Nerves: No cranial nerve deficit.  ?   Sensory: No sensory deficit.  ?   Motor: No weakness.  ?   Coordination: Coordination normal.  ? ? ? ?UC Treatments / Results  ?Labs ?(all labs ordered are listed, but only abnormal results are displayed) ?Labs Reviewed - No data to display ? ?EKG ? ? ?Radiology ?No results found. ? ?Procedures ?Procedures (including critical care time) ? ?Medications Ordered in UC ?Medications - No data to display ? ?Initial Impression / Assessment and Plan / UC Course  ?I have reviewed the triage vital signs and the nursing notes. ? ?Pertinent labs & imaging results that were available during my care of the patient were reviewed by me and considered in my medical decision making (see chart for details). ? ?  ? ?1.  Migraine with aura: ?Imitrex as needed for headaches ?Increase oral fluid intake ?Better sleep hygiene will help in reducing frequency of headaches ?Return to urgent care if symptoms worsen ?If the headache is persistent in spite of of improving sleep hygiene and taking medications that she may benefit from neurology evaluation. ?Final Clinical Impressions(s) / UC Diagnoses  ? ?Final diagnoses:  ?Migraine with aura and without status migrainosus, not intractable  ? ? ? ?Discharge Instructions   ? ?  ?Increase oral fluid intake ?Take medications as  prescribed ?Please monitor your blood pressure at home-measure your blood pressure about 2-3 times a week and keep a log of it. ?Return to urgent care if you have worsening symptoms ?By over-the-counter sleep aid to help with your sleep.  Good sleep practices will help with the headaches. ? ? ?ED Prescriptions   ? ? Medication Sig Dispense  Auth. Provider  ? SUMAtriptan (IMITREX) 50 MG tablet Take 1 tablet (50 mg total) by mouth once for 1 dose. May repeat in 2 hours if headache persists or recurs. 15 tablet Concetta Guion, Myrene Galas, MD  ? ?  ? ?PDMP not reviewed this encounter. ?  ?Chase Picket, MD ?04/24/22 1722 ? ?

## 2022-09-10 ENCOUNTER — Other Ambulatory Visit: Payer: Self-pay

## 2022-09-10 ENCOUNTER — Encounter (HOSPITAL_COMMUNITY): Payer: Self-pay | Admitting: Emergency Medicine

## 2022-09-10 ENCOUNTER — Emergency Department (HOSPITAL_COMMUNITY)
Admission: EM | Admit: 2022-09-10 | Discharge: 2022-09-11 | Disposition: A | Discharge status: Home / Self Care | Payer: BC Managed Care – PPO | Attending: Emergency Medicine | Admitting: Emergency Medicine

## 2022-09-10 ENCOUNTER — Ambulatory Visit (HOSPITAL_COMMUNITY)
Admission: EM | Admit: 2022-09-10 | Discharge: 2022-09-10 | Disposition: A | Discharge status: Home / Self Care | Payer: BC Managed Care – PPO | Attending: Internal Medicine | Admitting: Internal Medicine

## 2022-09-10 ENCOUNTER — Emergency Department (HOSPITAL_COMMUNITY): Payer: BC Managed Care – PPO

## 2022-09-10 DIAGNOSIS — R079 Chest pain, unspecified: Secondary | ICD-10-CM | POA: Diagnosis not present

## 2022-09-10 DIAGNOSIS — R072 Precordial pain: Secondary | ICD-10-CM | POA: Diagnosis not present

## 2022-09-10 DIAGNOSIS — R0602 Shortness of breath: Secondary | ICD-10-CM | POA: Diagnosis not present

## 2022-09-10 DIAGNOSIS — Z20822 Contact with and (suspected) exposure to covid-19: Secondary | ICD-10-CM | POA: Diagnosis not present

## 2022-09-10 DIAGNOSIS — R0789 Other chest pain: Secondary | ICD-10-CM | POA: Diagnosis not present

## 2022-09-10 LAB — BASIC METABOLIC PANEL
Anion gap: 10 (ref 5–15)
BUN: 10 mg/dL (ref 6–20)
CO2: 23 mmol/L (ref 22–32)
Calcium: 9.2 mg/dL (ref 8.9–10.3)
Chloride: 105 mmol/L (ref 98–111)
Creatinine, Ser: 0.83 mg/dL (ref 0.61–1.24)
GFR, Estimated: >60 mL/min (ref >60)
Glucose, Bld: 97 mg/dL (ref 70–99)
Potassium: 3.4 mmol/L — ABNORMAL LOW (ref 3.5–5.1)
Sodium: 138 mmol/L (ref 135–145)

## 2022-09-10 LAB — CBC
HCT: 44.6 % (ref 39.0–52.0)
Hemoglobin: 15.7 g/dL (ref 13.0–17.0)
MCH: 30.1 pg (ref 26.0–34.0)
MCHC: 35.2 g/dL (ref 30.0–36.0)
MCV: 85.6 fL (ref 80.0–100.0)
Platelets: 312 10*3/uL (ref 150–400)
RBC: 5.21 MIL/uL (ref 4.22–5.81)
RDW: 12.7 % (ref 11.5–15.5)
WBC: 10.1 10*3/uL (ref 4.0–10.5)
nRBC: 0 % (ref 0.0–0.2)

## 2022-09-10 LAB — RESP PANEL BY RT-PCR (FLU A&B, COVID) ARPGX2
Influenza A by PCR: NEGATIVE
Influenza B by PCR: NEGATIVE
SARS Coronavirus 2 by RT PCR: NEGATIVE

## 2022-09-10 LAB — TROPONIN I (HIGH SENSITIVITY): Troponin I (High Sensitivity): 3 ng/L (ref <18)

## 2022-09-10 NOTE — ED Notes (Signed)
Patient is being discharged from the Urgent Care and sent to the Emergency Department via POV with family. Per Lyndon Code, PA, patient is in need of higher level of care due to chest pain-to rule out PE. Patient is aware and verbalizes understanding of plan of care.  Vitals:   09/10/22 1859  BP: (!) 138/101  Pulse: 68  Resp: 15  Temp: 98.2 F (36.8 C)  SpO2: 99%

## 2022-09-10 NOTE — ED Triage Notes (Signed)
Pt c/o center, non-radiating chest pain worse with inhalation x 2-3 hours; pt was seen by UC today for same and sent to ED; denies n/v/d

## 2022-09-10 NOTE — ED Triage Notes (Signed)
Pt reports about 1.5 hour ago having central chest pain. Pt reports has to hunch over due to pain. Pain is worse with movement and taking a deep breath.

## 2022-09-10 NOTE — ED Provider Triage Note (Signed)
Emergency Medicine Provider Triage Evaluation Note  [name] , a 36 y.o. male  was evaluated in triage.  Pt complains of chest pain for the past 2 hours.  Patient states that he has had midsternal chest pain with associated shortness of breath had an episode while he is working earlier.  He notes some worsening pain with taking a deep breath as well as with certain movements.  Denies any history of cardiac or pulmonary issues.  He was seen by urgent care sent to the ED for further evaluation.  Notes some mild cough that has been present for the past few days.  Denies congestion, sore throat, abdominal pain, nausea, vomiting..  Review of Systems  Positive: See above Negative:   Physical Exam  BP (!) 139/101 (BP Location: Right Arm)   Pulse 63   Temp 98.4 F (36.9 C) (Oral)   Resp 16   Ht 5\' 6"  (1.676 m)   Wt 81.2 kg   SpO2 100%   BMI 28.89 kg/m  Gen:   Awake, no distress   Resp:  Normal effort  MSK:   Moves extremities without difficulty  Other:    Medical Decision Making  Medically screening exam initiated at 8:14 PM.  Appropriate orders placed.  [name] [place] was informed that the remainder of the evaluation will be completed by another provider, this initial triage assessment does not replace that evaluation, and the importance of remaining in the ED until their evaluation is complete.     Wilnette Kales, [place] 09/10/22 2015

## 2022-09-11 LAB — TROPONIN I (HIGH SENSITIVITY): Troponin I (High Sensitivity): 4 ng/L (ref <18)

## 2022-09-11 LAB — D-DIMER, QUANTITATIVE: D-Dimer, Quant: <0.27 ug/mL-FEU (ref 0.00–0.50)

## 2022-09-11 NOTE — ED Notes (Signed)
PT called X2 for vitals and labs with no response

## 2022-09-11 NOTE — ED Notes (Signed)
X1 for vitals recheck with no response 

## 2022-09-11 NOTE — Discharge Instructions (Signed)
Thank you for coming to [facility]. You were seen for chest pain. We did an exam, labs, and imaging, and these showed no acute findings. Please follow up with your primary care provider within 1 week.   Do not hesitate to return to the ED or call 911 if you experience: -Worsening symptoms -Lightheadedness, passing out -Fevers/chills -Anything else that concerns you  

## 2022-09-11 NOTE — ED Provider Notes (Signed)
8:32 AM Assumed care of patient from off-going team. For more details, please see note from same day.  In brief, this is a   Plan/Dispo at time of sign-out & ED Course since sign-out: [ ]  d-dimer  BP 119/86 (BP Location: Left Arm)   Pulse (!) 51   Temp 97.9 F (36.6 C) (Oral)   Resp 18   Ht 5\' 6"  (1.676 m)   Wt 81.2 kg   SpO2 95%   BMI 28.89 kg/m    ED Course:   Clinical Course as of 09/11/22 0832  Tue Sep 11, 2022  0832 D-Dimer, Quant: <0.27 [HN]    Clinical Course User Index [HN] Audley Hose, MD   D-dimer negative and patient's w/u reassuring. Patient states his pain is gone at this time. Instructed to f/u with PCP within 1 week.    Dispo: DC w/ DC instructions/return precautions ------------------------------- Cindee Lame, MD Emergency Medicine  This note was created using dictation software, which may contain spelling or grammatical errors.   Audley Hose, MD 09/11/22 [phone]

## 2022-09-11 NOTE — ED Provider Notes (Signed)
[facility] EMERGENCY DEPARTMENT Provider Note   CSN: [id number] Arrival date & time: 09/10/22  1913     History  Chief Complaint  Patient presents with   Chest Pain    [name] is a 36 y.o. male.  Patient presents to the emergency department for evaluation of chest pain.  Patient reports that he has been experiencing intermittent episodes of substernal chest pain since 5 PM.  Patient reports that the pain is mostly sharp, last for some minutes and then resolves.  He does feel short of breath, like he cannot take a full breath.  Pain worsens when he takes a deep breath.       Home Medications Prior to Admission medications   Medication Sig Start Date End Date Taking? Authorizing Provider  azelastine (OPTIVAR) 0.05 % ophthalmic solution Place 1 drop into both eyes 2 (two) times daily. 04/30/21   Particia Nearing, PA-C  fluticasone [facility]) 50 MCG/ACT nasal spray Place 1 spray into both nostrils in the morning and at bedtime. 04/30/21   Particia Nearing, PA-C  montelukast (SINGULAIR) 10 MG tablet Take 1 tablet (10 mg total) by mouth at bedtime. 04/30/21   Particia Nearing, PA-C  SUMAtriptan (IMITREX) 50 MG tablet Take 1 tablet (50 mg total) by mouth once for 1 dose. May repeat in 2 hours if headache persists or recurs. 04/20/22 04/20/22  LampteyBritta Mccreedy, MD  tiZANidine (ZANAFLEX) 4 MG tablet Take 1 tablet (4 mg total) by mouth at bedtime. 10/20/21   Wallis Bamberg, PA-C      Allergies    Patient has no known allergies.    Review of Systems   Review of Systems  Physical Exam Updated Vital Signs BP 119/86 (BP Location: Left Arm)   Pulse (!) 51   Temp 97.9 F (36.6 C) (Oral)   Resp 18   Ht 5\' 6"  (1.676 m)   Wt 81.2 kg   SpO2 95%   BMI 28.89 kg/m  Physical Exam Vitals and nursing note reviewed.  Constitutional:      General: He is not in acute distress.    Appearance: He is well-developed.  HENT:     Head: Normocephalic  and atraumatic.     Mouth/Throat:     Mouth: Mucous membranes are moist.  Eyes:     General: Vision grossly intact. Gaze aligned appropriately.     Extraocular Movements: Extraocular movements intact.     Conjunctiva/sclera: Conjunctivae normal.  Cardiovascular:     Rate and Rhythm: Normal rate and regular rhythm.     Pulses: Normal pulses.     Heart sounds: Normal heart sounds, S1 normal and S2 normal. No murmur heard.    No friction rub. No gallop.  Pulmonary:     Effort: Pulmonary effort is normal. No respiratory distress.     Breath sounds: Normal breath sounds.  Abdominal:     Palpations: Abdomen is soft.     Tenderness: There is no abdominal tenderness. There is no guarding or rebound.     Hernia: No hernia is present.  Musculoskeletal:        General: No swelling.     Cervical back: Full passive range of motion without pain, normal range of motion and neck supple. No pain with movement, spinous process tenderness or muscular tenderness. Normal range of motion.     Right lower leg: No edema.     Left lower leg: No edema.  Skin:    General: Skin  is warm and dry.     Capillary Refill: Capillary refill takes less than 2 seconds.     Findings: No ecchymosis, erythema, lesion or wound.  Neurological:     Mental Status: He is alert and oriented to person, place, and time.     GCS: GCS eye subscore is 4. GCS verbal subscore is 5. GCS motor subscore is 6.     Cranial Nerves: Cranial nerves 2-12 are intact.     Sensory: Sensation is intact.     Motor: Motor function is intact. No weakness or abnormal muscle tone.     Coordination: Coordination is intact.  Psychiatric:        Mood and Affect: Mood normal.        Speech: Speech normal.        Behavior: Behavior normal.     ED Results / Procedures / Treatments   Labs (all labs ordered are listed, but only abnormal results are displayed) Labs Reviewed  BASIC METABOLIC PANEL - Abnormal; Notable for the following components:       Result Value   Potassium 3.4 (*)    All other components within normal limits  RESP PANEL BY RT-PCR (FLU A&B, COVID) ARPGX2  CBC  D-DIMER, QUANTITATIVE  TROPONIN I (HIGH SENSITIVITY)  TROPONIN I (HIGH SENSITIVITY)    EKG None  Radiology DG Chest 2 View  Result Date: 09/10/2022 CLINICAL DATA:  Central chest pain. EXAM: CHEST - 2 VIEW COMPARISON:  July 04, 2016 FINDINGS: The heart size and mediastinal contours are within normal limits. Low lung volumes are noted. Both lungs are clear. The visualized skeletal structures are unremarkable. IMPRESSION: No active cardiopulmonary disease. Electronically Signed   By: Virgina Norfolk M.D.   On: 09/10/2022 21:12    Procedures Procedures    Medications Ordered in ED Medications - No data to display  ED Course/ Medical Decision Making/ A&P                           Medical Decision Making Amount and/or Complexity of Data Reviewed Labs: ordered. Radiology: ordered.   Patient presents with chest pain.  Pain is atypical for cardiac etiology and has no cardiac risk factors.  EKG without ischemic changes.  Troponins negative.  Patient appears well.  Vital signs are normal.  He is not tachycardic, tachypneic or hypoxic.  Discussion with the patient, however, reveals that he does drive 2 hours to work daily.  This certainly puts him at risk for DVT and PE.  We will perform D-dimer.  If negative, patient will be appropriate for discharge.  Signout to oncoming ER physician to follow-up on D-dimer.        Final Clinical Impression(s) / ED Diagnoses Final diagnoses:  Chest pain, unspecified type    Rx / DC Orders ED Discharge Orders     None         Orpah Greek, MD 09/11/22 [phone]

## 2022-09-19 NOTE — Progress Notes (Signed)
Subjective:    [name] - 36 y.o. male MRN [id number]  Date of birth: Jul 06, 1986  HPI  [name] is to establish care and emergency department follow-up.  Current issues and/or concerns: 09/10/2022 - 09/11/2022 [facility] per MD note:  Patient presents with chest pain.  Pain is atypical for cardiac etiology and has no cardiac risk factors.  EKG without ischemic changes.  Troponins negative.  Patient appears well.  Vital signs are normal.  He is not tachycardic, tachypneic or hypoxic.  Discussion with the patient, however, reveals that he does drive 2 hours to work daily.  This certainly puts him at risk for DVT and PE.  We will perform D-dimer.  If negative, patient will be appropriate for discharge.  Signout to oncoming ER physician to follow-up on D-dimer.   D-dimer negative and patient's w/u reassuring. Patient states his pain is gone at this time. Instructed to f/u with PCP within 1 week.   Thank you for coming to [facility] Emergency Department. You were seen for chest pain. We did an exam, labs, and imaging, and these showed no acute findings.  Please follow up with your primary care provider within 1 week.    Do not hesitate to return to the ED or call 911 if you experience: -Worsening symptoms -Lightheadedness, passing out -Fevers/chills -Anything else that concerns   Today's visit 09/20/2022: Reports still has occasional centralized chest pain. Comes and goes. Duration varies. No known family history of heart conditions. Does not have active chest pain or additional red flag symptoms today in office. No further issues/concerns he would like to discuss on today.     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 His smoking use included cigarettes. He smoked an average of .25 packs per day. He has been exposed to tobacco smoke. He has never used smokeless tobacco. He reports that he does not drink alcohol and does not use drugs.   Family History  family history includes Hypertension in his mother.   Medications: reviewed and updated   Objective:   Physical Exam BP 116/80 (BP Location: Left Arm, Patient Position: Sitting, Cuff Size: Large)   Pulse 61   Temp 98.3 F (36.8 C)   Resp 16   Ht 5' 5.24" (1.657 m)   Wt 180 lb (81.6 kg)   SpO2 96%   BMI  29.74 kg/m   Physical Exam HENT:     Head: Normocephalic and atraumatic.  Eyes:     Extraocular Movements: Extraocular movements intact.     Conjunctiva/sclera: Conjunctivae normal.     Pupils: Pupils are equal, round, and reactive to light.  Cardiovascular:     Rate and Rhythm: Normal rate and regular rhythm.     Pulses: Normal pulses.     Heart sounds: Normal heart sounds.  Pulmonary:     Effort: Pulmonary effort is normal.     Breath sounds: Normal breath sounds.  Musculoskeletal:     Cervical back: Normal range of motion and neck supple.  Neurological:     General: No focal deficit present.     Mental Status: He  is alert and oriented to person, place, and time.  Psychiatric:        Mood and Affect: Mood normal.        Behavior: Behavior normal.    Assessment & Plan:  1. Encounter to establish care - Patient presents today to establish care.  - Return for annual physical examination, labs, and health maintenance. Arrive fasting meaning having no food for at least 8 hours prior to appointment. You may have only water or black coffee. Please take scheduled medications as normal.  2. Chest pain, unspecified type - Patient today in office without cardiopulmonary distress.  - Referral to Cardiology for further evaluation and management.  - Ambulatory referral to Cardiology    Patient was given clear instructions to go to Emergency Department or return to medical center if symptoms don't improve, worsen, or new problems develop.The patient verbalized understanding.  I discussed the assessment and treatment plan with the patient. The patient was provided an opportunity to ask questions and all were answered. The patient agreed with the plan and demonstrated an understanding of the instructions.   The patient was advised to call back or seek an in-person evaluation if the symptoms worsen or if the condition fails to improve as anticipated.    Durene Fruits, NP 09/20/2022, 8:35 PM Primary Care at [facility]

## 2022-09-20 ENCOUNTER — Encounter: Payer: Self-pay | Admitting: Family

## 2022-09-20 ENCOUNTER — Ambulatory Visit: Payer: BC Managed Care – PPO | Admitting: Family

## 2022-09-20 VITALS — BP 116/80 | HR 61 | Temp 98.3°F | Resp 16 | Ht 65.24 in | Wt 180.0 lb

## 2022-09-20 DIAGNOSIS — Z7689 Persons encountering health services in other specified circumstances: Secondary | ICD-10-CM

## 2022-09-20 DIAGNOSIS — R079 Chest pain, unspecified: Secondary | ICD-10-CM | POA: Diagnosis not present

## 2022-09-20 NOTE — Progress Notes (Signed)
Pt presents to establish care and urgent care follow-up  -pt having frequent anxiety lately

## 2022-09-20 NOTE — Patient Instructions (Signed)
Thank you for choosing Primary Care at [facility] for your medical home!    [name] was seen by Camillia Herter, NP today.   [name]'s primary care provider is Camillia Herter, NP.   For the best care possible,  you should try to see Matthew Fruits, NP whenever you come to office.   We look forward to seeing you again soon!  If you have any questions about your visit today,  please call us at [phone]  Or feel free to reach your provider via [name].   Keeping you healthy   Get these tests Blood pressure- Have your blood pressure checked once a year by your healthcare provider.  Normal blood pressure is 120/80. Weight- Have your body mass index (BMI) calculated to screen for obesity.  BMI is a measure of body fat based on height and weight. You can also calculate your own BMI at GravelBags.it. Cholesterol- Have your cholesterol checked regularly starting at age 52, sooner may be necessary if you have diabetes, high blood pressure, if a family member developed heart diseases at an early age or if you smoke.  Chlamydia, HIV, and other sexual transmitted disease- Get screened each year until the age of 30 then within three months of each new sexual partner. Diabetes- Have your blood sugar checked regularly if you have high blood pressure, high cholesterol, a family history of diabetes or if you are overweight.   Get these vaccines Flu shot- Every fall. Tetanus shot- Every 10 years. Menactra- Single dose; prevents meningitis.   Take these steps Don't smoke- If you do smoke, ask your healthcare provider about quitting. For tips on how to quit, go to www.smokefree.gov or call 1-800-QUIT-NOW. Be physically active- Exercise 5 days a week for at least 30 minutes.  If you are not already physically active start slow and gradually work up to 30 minutes of moderate physical activity.  Examples of moderate activity include walking briskly, mowing the  yard, dancing, swimming bicycling, etc. Eat a healthy diet- Eat a variety of healthy foods such as [name], vegetables, low fat milk, low fat cheese, yogurt, lean meats, poultry, fish, beans, tofu, etc.  For more information on healthy eating, go to www.thenutritionsource.org Drink alcohol in moderation- Limit alcohol intake two drinks or less a day.  Never drink and drive. Dentist- Brush and floss teeth twice daily; visit your dentis twice a year. Depression-Your emotional health is as important as your physical health.  If you're feeling down, losing interest in things you normally enjoy please talk with your healthcare provider. Gun Safety- If you keep a gun in your home, keep it unloaded and with the safety lock on.  Bullets should be stored separately. Helmet use- Always wear a helmet when riding a motorcycle, bicycle, rollerblading or skateboarding. Safe sex- If you may be exposed to a sexually transmitted infection, use a condom Seat belts- Seat bels can save your life; always wear one. Smoke/Carbon Monoxide detectors- These detectors need to be installed on the appropriate level of your home.  Replace batteries at least once a year. Skin Cancer- When out in the sun, cover up and use sunscreen SPF 15 or higher. Violence- If anyone is threatening or hurting you, please tell your healthcare provider.

## 2022-10-19 ENCOUNTER — Encounter: Payer: Self-pay | Admitting: Cardiovascular Disease

## 2022-10-19 ENCOUNTER — Ambulatory Visit: Payer: BC Managed Care – PPO | Attending: Cardiovascular Disease | Admitting: Cardiovascular Disease

## 2022-10-19 VITALS — BP 103/66 | HR 51 | Ht 66.0 in | Wt 178.2 lb

## 2022-10-19 DIAGNOSIS — R072 Precordial pain: Secondary | ICD-10-CM | POA: Diagnosis not present

## 2022-10-19 NOTE — Patient Instructions (Signed)
Medication Instructions:  Your physician recommends that you continue on your current medications as directed. Please refer to the Current Medication list given to you today.  *If you need a refill on your cardiac medications before your next appointment, please call your pharmacy*  Lab Work: NONE ordered at this time of appointment   If you have labs (blood work) drawn today and your tests are completely normal, you will receive your results only by: MyChart Message (if you have MyChart) OR A paper copy in the mail If you have any lab test that is abnormal or we need to change your treatment, we will call you to review the results.  Testing/Procedures: NONE ordered at this time of appointment   Follow-Up: At [facility], you and your health needs are our priority.  As part of our continuing mission to provide you with exceptional heart care, we have created designated Provider Care Teams.  These Care Teams include your primary Cardiologist (physician) and Advanced Practice Providers (APPs -  Physician Assistants and Nurse Practitioners) who all work together to provide you with the care you need, when you need it.  Your next appointment:   As needed if symptoms worsen or fail to improve   The format for your next appointment:   In Person  Provider:   Mihai Croitoru, MD     Other Instructions   Important Information About Sugar       

## 2022-10-19 NOTE — Progress Notes (Signed)
Cardiology Office Note:    Date:  10/19/2022   ID:  [name], DOB Jul 23, 1986, MRN [id number]  PCP:  Matthew Fendt, NP   [facility] Providers Cardiologist:  None     Referring MD: Matthew Fendt, NP   No chief complaint on file. [name] is a 36 y.o. male who is being seen today for the evaluation of chest pain at the request of Matthew Fendt, NP.   History of Present Illness:    [name] is a 36 y.o. male with a hx of generally good physical health, remote history of depression, remote history of smoking, seen in the emergency room on 09/10/2022 for precordial pain.  The chest discomfort was severe and occurred at rest without any obvious trigger.  Although his symptoms were recorded in variable fashion and he has less precise recollection of the symptoms today, one of the ER providers described the pain as being sharp and lasting for several minutes at a time resolving and recurring.  He also reported that the pain felt worse when he took a deep breath.  It had occurred off and on in a milder fashion for several days before presentation.  He did not have any relationship with physical activity and did not worsen during activity.  He did not appear to have any association with meals.  By the time he arrived at the emergency room the discomfort has been going on for at least 2 hours.  2 sets of high-sensitivity troponin I were normal.  The D-dimer was below the lower limit of detectability.  His chest x-ray was normal.  His ECG did not show ischemic changes.  The chest discomfort associate with some mild shortness of breath, but he did not have dizziness, syncope, palpitations, leg swelling or tenderness, cough or hemoptysis, fever or chills.  The only meaningful lab abnormality identified during that ER visit was a potassium that was borderline low at 3.4.  Since then he has not had recurrent pain.  He is decided to join the gym and  goes and exercises without any chest pain complaints and without shortness of breath.  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  He does not have diabetes, hypercholesterolemia, hypertension or family history of CAD.  His mother has high blood pressure.  Used to smoke about 1/4 pack of cigarettes a day for about 10 years but quit 10 years ago.  Remotely used cocaine, none in the last 6 years.  He has 3 children ages 76-15.  He is originally from [place].  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  Current Medications: Current Meds  Medication Sig   Calcium Carbonate Antacid (TUMS PO) Take 1 tablet by mouth as needed.   Multiple Vitamins-Minerals (CENTRUM MEN PO) Take 1 tablet by mouth daily in the afternoon.   OVER THE COUNTER MEDICATION Take 2 tablets by mouth daily. Natures Bounty Anxiety and stress     Allergies:   Patient has no known allergies.   Social History   Socioeconomic History   Marital status: Married    Spouse name: Rosa   Number of children: 3   Years of education: Not on file   Highest education level: High school graduate  Occupational History   Not  on file  Tobacco Use   Smoking status: Former    Packs/day: 0.25    Types: Cigarettes    Quit date: 07/24/2012    Years since quitting: 10.2    Passive exposure: Past   Smokeless tobacco: Never  Vaping Use   Vaping Use: Never used  Substance and Sexual Activity   Alcohol use: Never   Drug use: No   Sexual activity: Yes  Other Topics Concern   Not on file  Social History Narrative   Not on file   Social Determinants of Health   Financial Resource Strain: Low Risk  (04/02/2018)   Overall Financial Resource Strain (CARDIA)    Difficulty of Paying Living Expenses: Not hard at all  Food Insecurity: No Food  Insecurity (04/02/2018)   Hunger Vital Sign    Worried About Running Out of Food in the Last Year: Never true    Ran Out of Food in the Last Year: Never true  Transportation Needs: No Transportation Needs (04/02/2018)   PRAPARE - Administrator, Civil Service (Medical): No    Lack of Transportation (Non-Medical): No  Physical Activity: Inactive (04/02/2018)   Exercise Vital Sign    Days of Exercise per Week: 0 days    Minutes of Exercise per Session: 0 min  Stress: Stress Concern Present (04/02/2018)   [employer] of Occupational Health - Occupational Stress Questionnaire    Feeling of Stress : To some extent  Social Connections: Moderately Integrated (04/02/2018)   Social Connection and Isolation Panel [NHANES]    Frequency of Communication with Friends and Family: More than three times a week    Frequency of Social Gatherings with Friends and Family: Three times a week    Attends Religious Services: 1 to 4 times per year    Active Member of Clubs or Organizations: No    Attends Banker Meetings: Never    Marital Status: Married     Family History: The patient's family history includes Hypertension in his mother.  ROS:   Please see the history of present illness.     All other systems reviewed and are negative.  EKGs/Labs/Other Studies Reviewed:    The following studies were reviewed today:   EKG:  EKG is  ordered today.  The ekg ordered today demonstrates sinus bradycardia, otherwise completely normal tracing.  Recent Labs: 09/10/2022: BUN 10; Creatinine, Ser 0.83; Hemoglobin 15.7; Platelets 312; Potassium 3.4; Sodium 138  Recent Lipid Panel    Component Value Date/Time   CHOL 113 (L) 12/06/2015 0828   TRIG 95 12/06/2015 0828   HDL 36 (L) 12/06/2015 0828   CHOLHDL 3.1 12/06/2015 0828   VLDL 19 12/06/2015 0828   LDLCALC 58 12/06/2015 0828     Risk Assessment/Calculations:                Physical Exam:    VS:  BP 103/66 (BP  Location: Left Arm, Patient Position: Sitting, Cuff Size: Normal)   Pulse (!) 51   Ht 5\' 6"  (1.676 m)   Wt 80.8 kg   SpO2 98%   BMI 28.76 kg/m     Wt Readings from Last 3 Encounters:  10/19/22 80.8 kg  09/20/22 81.6 kg  09/10/22 81.2 kg     GEN: Mildly overweight, but appears fit, well nourished, well developed in no acute distress HEENT: Normal NECK: No JVD; No carotid bruits LYMPHATICS: No lymphadenopathy CARDIAC: RRR, no murmurs, rubs, gallops RESPIRATORY:  Clear to auscultation without rales,  wheezing or rhonchi  ABDOMEN: Soft, non-tender, non-distended MUSCULOSKELETAL:  No edema; No deformity  SKIN: Warm and dry NEUROLOGIC:  Alert and oriented x 3 PSYCHIATRIC:  Normal affect   ASSESSMENT:    No diagnosis found. PLAN:    In order of problems listed above:  Precordial pain: This did not have features typical for angina pectoris.  Despite prolonged discomfort his cardiac enzymes were normal and his ECG did not show ischemic changes.  Retrospectively, since the ER doctor described the symptoms as pleuritic, it is possible that he had pericarditis.  At this point he is asymptomatic and further testing 1 month later is unlikely to reveal a diagnosis.  Offered coronary CT angiography, but my suspicion for CAD is very low (he does not have any coronary risk factors other than minimally low HDL cholesterol and is very).  He does not want to go ahead with coronary CT.  Advised continuing to go to the gym and exercise and try and lose some weight to improve his HDL cholesterol.  Congratulated him on not smoking or not and not using recreational stimulants.  He can follow-up as needed if he has recurrent symptoms.           Medication Adjustments/Labs and Tests Ordered: Current medicines are reviewed at length with the patient today.  Concerns regarding medicines are outlined above.  No orders of the defined types were placed in this encounter.  No orders of the defined types  were placed in this encounter.   There are no Patient Instructions on file for this visit.   Signed, Thurmon Fair, MD  10/19/2022 4:17 PM    [facility]

## 2022-11-08 NOTE — Progress Notes (Signed)
Patient ID: [name], male    DOB: 05/28/1986  MRN: [id number]  CC: Annual Physical Exam  Subjective: [name] is a 36 y.o. male who presents for annual physical exam.   His concerns today include: Request refills on nasal spray.   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 (Patient not taking: Reported on 10/19/2022) 6 mL 2   Calcium Carbonate Antacid (TUMS PO) Take 1 tablet by mouth as needed.     montelukast (SINGULAIR) 10 MG tablet Take 1 tablet (10 mg total) by mouth at bedtime. (Patient not taking: Reported on 10/19/2022) 30 tablet 2   Multiple Vitamins-Minerals (CENTRUM MEN PO) Take 1 tablet by mouth daily in the afternoon.     OVER THE COUNTER MEDICATION Take 2 tablets by mouth daily. Natures Bounty Anxiety and stress     SUMAtriptan (IMITREX) 50 MG tablet Take 1 tablet (50 mg total) by mouth once for 1 dose. May repeat in 2 hours if headache persists or recurs. (Patient not taking: Reported on 10/19/2022) 15 tablet 0   No current facility-administered medications on file prior to visit.    No Known Allergies  Social History   Socioeconomic History   Marital status: Married    Spouse name: [name]   Number of children: 3   Years of education: Not on file   Highest education level: High school  graduate  Occupational History   Not on file  Tobacco Use   Smoking status: Former    Packs/day: 0.25    Types: Cigarettes    Quit date: 07/24/2012    Years since quitting: 10.3    Passive exposure: Past   Smokeless tobacco: Never  Vaping Use   Vaping Use: Never used  Substance and Sexual Activity   Alcohol use: Never   Drug use: No   Sexual activity: Yes  Other Topics Concern   Not on file  Social History Narrative   Not on file   Social Determinants of Health   Financial Resource Strain: Low Risk  (04/02/2018)   Overall Financial Resource Strain (CARDIA)    Difficulty of Paying Living Expenses: Not hard at all  Food Insecurity: No Food Insecurity (04/02/2018)   Hunger Vital Sign    Worried About Running Out of Food in the Last Year: Never true    Ran Out of Food in the Last Year: Never true  Transportation Needs: No Transportation Needs (04/02/2018)   PRAPARE - Administrator, Civil Service (Medical): No    Lack of Transportation (Non-Medical): No  Physical Activity: Inactive (04/02/2018)   Exercise Vital Sign    Days of Exercise per Week: 0 days    Minutes of Exercise per Session: 0 min  Stress: Stress Concern Present (04/02/2018)   [employer] of Occupational Health - Occupational Stress Questionnaire    Feeling  of Stress : To some extent  Social Connections: Moderately Integrated (04/02/2018)   Social Connection and Isolation Panel [NHANES]    Frequency of Communication with Friends and Family: More than three times a week    Frequency of Social Gatherings with Friends and Family: Three times a week    Attends Religious Services: 1 to 4 times per year    Active Member of Clubs or Organizations: No    Attends Banker Meetings: Never    Marital Status: Married  Catering manager Violence: Not At Risk (04/02/2018)   Humiliation, Afraid, Rape, and Kick questionnaire    Fear of Current or Ex-Partner: No    Emotionally Abused: No    Physically  Abused: No    Sexually Abused: No    Family History  Problem Relation Age of Onset   Hypertension Mother     No past surgical history on file.  ROS: Review of Systems Negative except as stated above  PHYSICAL EXAM: BP 118/83 (BP Location: Left Arm, Patient Position: Sitting, Cuff Size: Large)   Pulse 74   Temp 98.3 F (36.8 C)   Resp 16   Ht 5' 5.98" (1.676 m)   Wt 173 lb (78.5 kg)   SpO2 95%   BMI 27.94 kg/m   Physical Exam HENT:     Head: Normocephalic and atraumatic.     Right Ear: Tympanic membrane, ear canal and external ear normal.     Left Ear: Tympanic membrane, ear canal and external ear normal.     Nose: Nose normal.     Mouth/Throat:     Mouth: Mucous membranes are moist.     Pharynx: Oropharynx is clear.  Eyes:     Extraocular Movements: Extraocular movements intact.     Conjunctiva/sclera: Conjunctivae normal.     Pupils: Pupils are equal, round, and reactive to light.  Cardiovascular:     Rate and Rhythm: Normal rate and regular rhythm.     Pulses: Normal pulses.     Heart sounds: Normal heart sounds.  Pulmonary:     Effort: Pulmonary effort is normal.     Breath sounds: Normal breath sounds.  Abdominal:     General: Bowel sounds are normal.     Palpations: Abdomen is soft.  Genitourinary:    Comments: Patient declined.  Musculoskeletal:        General: Normal range of motion.     Right shoulder: Normal.     Left shoulder: Normal.     Right upper arm: Normal.     Left upper arm: Normal.     Right elbow: Normal.     Left elbow: Normal.     Right forearm: Normal.     Left forearm: Normal.     Right wrist: Normal.     Left wrist: Normal.     Right hand: Normal.     Left hand: Normal.     Cervical back: Normal, normal range of motion and neck supple.     Thoracic back: Normal.     Lumbar back: Normal.     Right hip: Normal.     Left hip: Normal.     Right upper leg: Normal.     Left upper leg: Normal.     Right knee: Normal.     Left  knee: Normal.     Right lower leg: Normal.     Left lower leg: Normal.     Right ankle: Normal.     Left ankle:  Normal.     Right foot: Normal.     Left foot: Normal.  Skin:    General: Skin is warm and dry.     Capillary Refill: Capillary refill takes less than 2 seconds.  Neurological:     General: No focal deficit present.     Mental Status: He is alert and oriented to person, place, and time.  Psychiatric:        Mood and Affect: Mood normal.        Behavior: Behavior normal.     ASSESSMENT AND PLAN: 1. Annual physical exam - Counseled on 150 minutes of exercise per week as tolerated, healthy eating (including decreased daily intake of saturated fats, cholesterol, added sugars, sodium), STI prevention, and routine healthcare maintenance.  2. Screening for metabolic disorder - Routine screening.  - Hepatic Function Panel  3. Diabetes mellitus screening - Routine screening.  - Hemoglobin A1c  4. Screening cholesterol level - Routine screening.  - Lipid panel  5. Thyroid disorder screen - Routine screening.  - TSH  6. Need for hepatitis C screening test - Routine screening.  - Hepatitis C Antibody  7. Perennial allergic rhinitis - Continue Fluticasone as prescribed.  - Follow-up with primary provider as scheduled.  - fluticasone (FLONASE) 50 MCG/ACT nasal spray; Place 1 spray into both nostrils in the morning and at bedtime.  Dispense: 16 g; Refill: 2   Patient was given the opportunity to ask questions.  Patient verbalized understanding of the plan and was able to repeat key elements of the plan. Patient was given clear instructions to go to Emergency Department or return to medical center if symptoms don't improve, worsen, or new problems develop.The patient verbalized understanding.   Orders Placed This Encounter  Procedures   Hepatitis C Antibody   Lipid panel   TSH   Hemoglobin A1c   Hepatic Function Panel     Requested Prescriptions   Signed  Prescriptions Disp Refills   fluticasone (FLONASE) 50 MCG/ACT nasal spray 16 g 2    Sig: Place 1 spray into both nostrils in the morning and at bedtime.    Return in about 1 year (around 11/17/2023) for Physical per patient preference.  Rema Fendt, NP

## 2022-11-16 ENCOUNTER — Ambulatory Visit (INDEPENDENT_AMBULATORY_CARE_PROVIDER_SITE_OTHER): Payer: BC Managed Care – PPO | Admitting: Family

## 2022-11-16 VITALS — BP 118/83 | HR 74 | Temp 98.3°F | Resp 16 | Ht 65.98 in | Wt 173.0 lb

## 2022-11-16 DIAGNOSIS — Z0001 Encounter for general adult medical examination with abnormal findings: Secondary | ICD-10-CM | POA: Diagnosis not present

## 2022-11-16 DIAGNOSIS — Z13220 Encounter for screening for lipoid disorders: Secondary | ICD-10-CM

## 2022-11-16 DIAGNOSIS — Z13228 Encounter for screening for other metabolic disorders: Secondary | ICD-10-CM | POA: Diagnosis not present

## 2022-11-16 DIAGNOSIS — Z0000 Encounter for general adult medical examination without abnormal findings: Secondary | ICD-10-CM

## 2022-11-16 DIAGNOSIS — Z131 Encounter for screening for diabetes mellitus: Secondary | ICD-10-CM

## 2022-11-16 DIAGNOSIS — J3089 Other allergic rhinitis: Secondary | ICD-10-CM | POA: Diagnosis not present

## 2022-11-16 DIAGNOSIS — Z1159 Encounter for screening for other viral diseases: Secondary | ICD-10-CM | POA: Diagnosis not present

## 2022-11-16 DIAGNOSIS — Z1329 Encounter for screening for other suspected endocrine disorder: Secondary | ICD-10-CM | POA: Diagnosis not present

## 2022-11-16 MED ORDER — FLUTICASONE PROPIONATE 50 MCG/ACT NA SUSP: 1.0000 | Freq: Two times a day (BID) | NASAL | 2 refills | Status: DC

## 2022-11-16 NOTE — Patient Instructions (Signed)
Preventive Care 21-36 Years Old, Male Preventive care refers to lifestyle choices and visits with your health care provider that can promote health and wellness. Preventive care visits are also called wellness exams. What can I expect for my preventive care visit? Counseling During your preventive care visit, your health care provider may ask about your: Medical history, including: Past medical problems. Family medical history. Current health, including: Emotional well-being. Home life and relationship well-being. Sexual activity. Lifestyle, including: Alcohol, nicotine or tobacco, and drug use. Access to firearms. Diet, exercise, and sleep habits. Safety issues such as seatbelt and bike helmet use. Sunscreen use. Work and work environment. Physical exam Your health care provider may check your: Height and weight. These may be used to calculate your BMI (body mass index). BMI is a measurement that tells if you are at a healthy weight. Waist circumference. This measures the distance around your waistline. This measurement also tells if you are at a healthy weight and may help predict your risk of certain diseases, such as type 2 diabetes and high blood pressure. Heart rate and blood pressure. Body temperature. Skin for abnormal spots. What immunizations do I need?  Vaccines are usually given at various ages, according to a schedule. Your health care provider will recommend vaccines for you based on your age, medical history, and lifestyle or other factors, such as travel or where you work. What tests do I need? Screening Your health care provider may recommend screening tests for certain conditions. This may include: Lipid and cholesterol levels. Diabetes screening. This is done by checking your blood sugar (glucose) after you have not eaten for a while (fasting). Hepatitis B test. Hepatitis C test. HIV (human immunodeficiency virus) test. STI (sexually transmitted infection)  testing, if you are at risk. Talk with your health care provider about your test results, treatment options, and if necessary, the need for more tests. Follow these instructions at home: Eating and drinking  Eat a healthy diet that includes fresh fruits and vegetables, whole grains, lean protein, and low-fat dairy products. Drink enough fluid to keep your urine pale yellow. Take vitamin and mineral supplements as recommended by your health care provider. Do not drink alcohol if your health care provider tells you not to drink. If you drink alcohol: Limit how much you have to 0-2 drinks a day. Know how much alcohol is in your drink. In the [place] one drink equals one 12 oz bottle of beer (355 mL), one 5 oz glass of wine (148 mL), or one 1 oz glass of hard liquor (44 mL). Lifestyle Brush your teeth every morning and night with fluoride toothpaste. Floss one time each day. Exercise for at least 30 minutes 5 or more days each week. Do not use any products that contain nicotine or tobacco. These products include cigarettes, chewing tobacco, and vaping devices, such as e-cigarettes. If you need help quitting, ask your health care provider. Do not use drugs. If you are sexually active, practice safe sex. Use a condom or other form of protection to prevent STIs. Find healthy ways to manage stress, such as: Meditation, yoga, or listening to music. Journaling. Talking to a trusted person. Spending time with friends and family. Minimize exposure to UV radiation to reduce your risk of skin cancer. Safety Always wear your seat belt while driving or riding in a vehicle. Do not drive: If you have been drinking alcohol. Do not ride with someone who has been drinking. If you have been using any mind-altering substances   or drugs. While texting. When you are tired or distracted. Wear a helmet and other protective equipment during sports activities. If you have firearms in your house, make sure you  follow all gun safety procedures. Seek help if you have been physically or sexually abused. What's next? Go to your health care provider once a year for an annual wellness visit. Ask your health care provider how often you should have your eyes and teeth checked. Stay up to date on all vaccines. This information is not intended to replace advice given to you by your health care provider. Make sure you discuss any questions you have with your health care provider. Document Revised: 05/24/2021 Document Reviewed: 05/24/2021 Elsevier Patient Education  2023 [employer]  

## 2022-11-16 NOTE — Progress Notes (Signed)
.  Pt presents for annual physical exam  

## 2022-11-17 LAB — HEPATIC FUNCTION PANEL
ALT: 26 IU/L (ref 0–44)
AST: 26 IU/L (ref 0–40)
Albumin: 4.9 g/dL (ref 4.1–5.1)
Alkaline Phosphatase: 60 IU/L (ref 44–121)
Bilirubin Total: 0.5 mg/dL (ref 0.0–1.2)
Bilirubin, Direct: 0.15 mg/dL (ref 0.00–0.40)
Total Protein: 7.4 g/dL (ref 6.0–8.5)

## 2022-11-17 LAB — HEMOGLOBIN A1C
Est. average glucose Bld gHb Est-mCnc: 111 mg/dL
Hgb A1c MFr Bld: 5.5 % (ref 4.8–5.6)

## 2022-11-17 LAB — LIPID PANEL
Chol/HDL Ratio: 3.3 ratio (ref 0.0–5.0)
Cholesterol, Total: 148 mg/dL (ref 100–199)
HDL: 45 mg/dL (ref >39)
LDL Chol Calc (NIH): 90 mg/dL (ref 0–99)
Triglycerides: 62 mg/dL (ref 0–149)
VLDL Cholesterol Cal: 13 mg/dL (ref 5–40)

## 2022-11-17 LAB — TSH: TSH: 1.66 u[IU]/mL (ref 0.450–4.500)

## 2022-11-17 LAB — HEPATITIS C ANTIBODY: Hep C Virus Ab: NONREACTIVE

## 2022-12-27 ENCOUNTER — Ambulatory Visit: Payer: BC Managed Care – PPO | Admitting: Family

## 2023-01-21 ENCOUNTER — Ambulatory Visit: Payer: BC Managed Care – PPO | Admitting: Family

## 2023-04-13 ENCOUNTER — Ambulatory Visit
Admission: EM | Admit: 2023-04-13 | Discharge: 2023-04-13 | Disposition: A | Discharge status: Home / Self Care | Payer: BC Managed Care – PPO | Attending: Internal Medicine | Admitting: Internal Medicine

## 2023-04-13 DIAGNOSIS — Z760 Encounter for issue of repeat prescription: Secondary | ICD-10-CM

## 2023-04-13 DIAGNOSIS — M545 Low back pain, unspecified: Secondary | ICD-10-CM | POA: Diagnosis not present

## 2023-04-13 DIAGNOSIS — G43009 Migraine without aura, not intractable, without status migrainosus: Secondary | ICD-10-CM | POA: Diagnosis not present

## 2023-04-13 DIAGNOSIS — J302 Other seasonal allergic rhinitis: Secondary | ICD-10-CM

## 2023-04-13 MED ORDER — SUMATRIPTAN SUCCINATE 50 MG PO TABS: 50.0000 mg | ORAL_TABLET | Freq: Once | ORAL | 0 refills | Status: DC

## 2023-04-13 MED ORDER — CYCLOBENZAPRINE HCL 5 MG PO TABS: 5.0000 mg | ORAL_TABLET | Freq: Two times a day (BID) | ORAL | 0 refills | Status: DC | PRN

## 2023-04-13 MED ORDER — MONTELUKAST SODIUM 10 MG PO TABS: 10.0000 mg | ORAL_TABLET | Freq: Every day | ORAL | 0 refills | Status: DC

## 2023-04-13 NOTE — ED Provider Notes (Signed)
[facility]    CSN: [id number] Arrival date & time: 04/13/23  0808      History   Chief Complaint Chief Complaint  Patient presents with   Back Pain    HPI [name] is a 37 y.o. male.   Patient presents today for 2 different chief complaints.  Patient reports that he was requesting medication refill on Imitrex and montelukast.  He reports that he has been taking these medications for approximately 1 year and tolerating well.  He takes Imitrex as needed for migraines.  Migraines occur approximately weekly to every other week.  He is not sure what triggers his migraines.  Last took Imitrex approximately 3 days ago for a migraine.  Has never seen neurology.  Patient takes montelukast as needed for allergies and has been taking for about a year as well.  He is tolerating both medications very well. Patient not reporting any current migraine.   Patient reports left lower back pain that has been present for about 3 weeks.  Denies any obvious injury to the area or any history of chronic back pain.  Patient denies any strenuous work on a daily basis that could have caused back pain.  Has taken ibuprofen and Goody powders with no improvement in back pain.  Pain does not radiate down legs.  He denies any urinary burning, urinary frequency, urinary or bowel incontinence.   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 No pertinent surgical history.     Home Medications    Prior to Admission medications   Medication Sig Start Date End Date Taking? Authorizing Provider  cyclobenzaprine (FLEXERIL) 5 MG tablet Take 1 tablet (5 mg total) by mouth 2 (two) times daily as needed for muscle spasms. 04/13/23  Yes Goldia Ligman, Rolly Salter E, FNP  azelastine (OPTIVAR) 0.05 % ophthalmic solution Place 1 drop into both eyes 2 (two) times daily. Patient not taking: Reported on 10/19/2022 04/30/21   Particia Nearing, PA-C  Calcium Carbonate Antacid (TUMS PO) Take 1 tablet by mouth as needed.    [provider]  fluticasone (FLONASE) 50 MCG/ACT nasal spray Place 1 spray into both nostrils in the morning and at bedtime. 11/16/22   Rema Fendt, NP  montelukast (SINGULAIR) 10 MG tablet Take 1 tablet (10 mg total) by mouth at bedtime. 04/13/23   Gustavus Bryant, FNP  Multiple Vitamins-Minerals (CENTRUM MEN PO) Take 1 tablet by mouth daily in the afternoon.    [provider]  OVER THE COUNTER MEDICATION Take 2 tablets by mouth daily. Natures Bounty Anxiety and stress    [provider]  SUMAtriptan (IMITREX) 50 MG tablet Take 1 tablet (50 mg total) by mouth once for 1 dose. May repeat in 2 hours if headache persists or recurs. 04/13/23 04/13/23  Gustavus Bryant, FNP    Family History Family History  Problem Relation Age of Onset   Hypertension Mother     Social History Social History   Tobacco Use   Smoking status: Former    Packs/day: .25    Types: Cigarettes    Quit date: 07/24/2012    Years since quitting: 10.7    Passive exposure: Past   Smokeless tobacco: Never  Vaping Use   Vaping Use: Never used  Substance Use Topics   Alcohol use: Never   Drug use: No     Allergies   Patient has no known allergies.   Review of Systems Review of Systems Per HPI  Physical Exam Triage Vital Signs ED Triage Vitals  Enc  Vitals Group     BP 04/13/23 0825 110/64     Pulse Rate 04/13/23 0825 (!) 58     Resp 04/13/23 0825 16     Temp 04/13/23 0825 98 F (36.7 C)     Temp Source 04/13/23 0825 Oral     SpO2 04/13/23 0825 98 %     Weight --      Height --      Head Circumference --      Peak Flow --      Pain Score 04/13/23 0824 4     Pain Loc --      Pain Edu? --      Excl. in GC? --    No data found.  Updated Vital Signs BP 110/64 (BP Location: Right Arm)   Pulse (!) 58   Temp 98 F (36.7 C) (Oral)   Resp 16   SpO2 98%   Visual Acuity Right Eye Distance:   Left Eye Distance:   Bilateral Distance:    Right Eye Near:   Left Eye Near:    Bilateral Near:     Physical Exam Constitutional:      General: He is not in acute distress.    Appearance: Normal appearance. He is not toxic-appearing or diaphoretic.  HENT:     Head: Normocephalic and atraumatic.  Eyes:     Extraocular Movements: Extraocular movements intact.     Conjunctiva/sclera: Conjunctivae normal.  Pulmonary:     Effort: Pulmonary effort is normal.  Musculoskeletal:       Back:     Comments: Patient has tenderness to palpation to left lower thoracic/left upper lumbar area.  There is no direct spinal tenderness, crepitus, step-off, swelling, discoloration noted.  Neurological:     General: No focal deficit present.     Mental Status: He is alert and oriented to person, place, and time. Mental status is at baseline.  Psychiatric:        Mood and Affect: Mood normal.        Behavior: Behavior normal.        Thought Content: Thought content normal.        Judgment: Judgment normal.      UC Treatments / Results  Labs (all labs ordered are listed, but only abnormal results are displayed) Labs Reviewed - No data to display  EKG   Radiology No results found.  Procedures Procedures (including critical care time)  Medications Ordered in UC Medications - No data to display  Initial Impression / Assessment and  Plan / UC Course  I have reviewed the triage vital signs and the nursing notes.  Pertinent labs & imaging results that were available during my care of the patient were reviewed by me and considered in my medical decision making (see chart for details).  1.  Medication refill  After further review of the chart, it appears the patient has been taking montelukast and Imitrex for approximately 1 year and tolerating well.  Will refill Imitrex and montelukast but patient was encouraged to follow-up with his family medicine doctor for any further refills.  Given migraines appear to be frequent, encouraged to follow-up with neurology at provided contact information as well.  Patient verbalized understanding and was agreeable.  2.  Low back pain  Suspect muscle strain/inflammation.  Given no direct spinal tenderness or injury, will defer imaging.  Patient prescribed muscle relaxer to take as needed.  Advised patient this medication can make him drowsy and do not drive or drink alcohol with taking it.  Advised supportive care including alternating ice and heat to affected area.  Advised follow-up if any symptoms persist or worsen.  Patient verbalized understanding and was agreeable with plan. Final Clinical Impressions(s) / UC Diagnoses   Final diagnoses:  Acute left-sided low back pain without sciatica  Medication refill  Seasonal allergies  Migraine without aura and without status migrainosus, not intractable     Discharge Instructions      I have refilled your medications.  Please follow-up with provided contact information for neurology given frequent headaches.  I have prescribed you a muscle relaxer to take as needed for your back pain.  Please be aware that it can make you drowsy so no driving or drinking alcohol with it.  Continue alternating ice and heat to affected area and follow-up if back pain persists or worsens.    ED Prescriptions     Medication Sig Dispense Auth.  Provider   montelukast (SINGULAIR) 10 MG tablet Take 1 tablet (10 mg total) by mouth at bedtime. 30 tablet [place], [place] E, [place]   SUMAtriptan (IMITREX) 50 MG tablet Take 1 tablet (50 mg total) by mouth once for 1 dose. May repeat in 2 hours if headache persists or recurs. 15 tablet [place], [place] E, [place]   cyclobenzaprine (FLEXERIL) 5 MG tablet Take 1 tablet (5 mg total) by mouth 2 (two) times daily as needed for muscle spasms. 20 tablet [place], Acie Fredrickson, [place]      PDMP not reviewed this encounter.   Gustavus Bryant, [place] 04/13/23 [phone]

## 2023-04-13 NOTE — Discharge Instructions (Signed)
I have refilled your medications.  Please follow-up with provided contact information for neurology given frequent headaches.  I have prescribed you a muscle relaxer to take as needed for your back pain.  Please be aware that it can make you drowsy so no driving or drinking alcohol with it.  Continue alternating ice and heat to affected area and follow-up if back pain persists or worsens.

## 2023-04-13 NOTE — ED Triage Notes (Signed)
Patient presents to UC for back pain x 3 weeks. No injury. States treating pain with ibuprofen and goody's none this morning. He is also requesting refill for Imitrex and montelukast.

## 2023-05-10 DIAGNOSIS — H40033 Anatomical narrow angle, bilateral: Secondary | ICD-10-CM | POA: Diagnosis not present

## 2023-05-10 DIAGNOSIS — H1013 Acute atopic conjunctivitis, bilateral: Secondary | ICD-10-CM | POA: Diagnosis not present

## 2024-02-09 ENCOUNTER — Ambulatory Visit (HOSPITAL_COMMUNITY)
Admission: EM | Admit: 2024-02-09 | Discharge: 2024-02-09 | Disposition: A | Discharge status: Home / Self Care | Attending: Emergency Medicine | Admitting: Emergency Medicine

## 2024-02-09 DIAGNOSIS — M549 Dorsalgia, unspecified: Secondary | ICD-10-CM | POA: Diagnosis not present

## 2024-02-09 MED ORDER — DEXAMETHASONE SODIUM PHOSPHATE 10 MG/ML IJ SOLN
INTRAMUSCULAR | Status: AC
  Filled 2024-02-09: qty 1

## 2024-02-09 MED ORDER — DEXAMETHASONE SODIUM PHOSPHATE 10 MG/ML IJ SOLN
10.0000 mg | Freq: Once | INTRAMUSCULAR | Status: AC
  Administered 2024-02-09: 10 mg via INTRAMUSCULAR

## 2024-02-09 MED ORDER — METHOCARBAMOL 500 MG PO TABS: 500.0000 mg | ORAL_TABLET | Freq: Two times a day (BID) | ORAL | 0 refills | Status: DC

## 2024-02-09 NOTE — ED Triage Notes (Addendum)
 Pt c/o upper to mid back pain for a week. Repots that does lots of lifting at work. Took Goody's which helped but doesn't want to have to take medication "like that".  Pt reports had central chest pain when exhaling after taking a deep breath. Denies those pains today.   Pt wearing back brace, tried icy hot.  Denies urinary or bowel problems.

## 2024-02-09 NOTE — ED Provider Notes (Addendum)
 [facility]    CSN: [id number] Arrival date & time: 02/09/24  1501      History   Chief Complaint Chief Complaint  Patient presents with   Back Pain    HPI [name] is a 38 y.o. male.   Patient presents to clinic for mid back pain that has been ongoing for the past week.  He does do a lot of lifting at work.  He has been wearing a brace that has been helping.  Sometimes the pain will be so bad it is hard to get up out of bed in the morning.  Moving, bending and twisting all exacerbate his pain.  Denies any trauma or falls.  Did take Goody's powder at home which helped temporarily.  Had an episode of chest pain throughout Thursday and Friday.  This is since resolved. Denies ETOH or drug use.   Has not had any urinary or bowel issues.  The history is provided by the patient and medical records.  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     Home Medications    Prior to Admission medications   Medication Sig Start Date End Date Taking? Authorizing Provider  methocarbamol (ROBAXIN) 500 MG tablet Take 1 tablet (500 mg total) by mouth 2 (two) times daily. 02/09/24  Yes Rinaldo Ratel, [place] N, FNP  azelastine (OPTIVAR) 0.05 % ophthalmic solution Place 1 drop into both eyes 2 (two) times daily. Patient not taking:  Reported on 10/19/2022 04/30/21   Particia Nearing, PA-C  Calcium Carbonate Antacid (TUMS PO) Take 1 tablet by mouth as needed.    [provider]  cyclobenzaprine (FLEXERIL) 5 MG tablet Take 1 tablet (5 mg total) by mouth 2 (two) times daily as needed for muscle spasms. 04/13/23   Gustavus Bryant, FNP  fluticasone (FLONASE) 50 MCG/ACT nasal spray Place 1 spray into both nostrils in the morning and at bedtime. 11/16/22   Rema Fendt, NP  montelukast (SINGULAIR) 10 MG tablet Take 1 tablet (10 mg total) by mouth at bedtime. 04/13/23   Gustavus Bryant, FNP  Multiple Vitamins-Minerals (CENTRUM MEN PO) Take 1 tablet by mouth daily in the afternoon.    [provider]  OVER THE COUNTER MEDICATION Take 2 tablets by mouth daily. Natures Bounty Anxiety and stress    [provider]  SUMAtriptan (IMITREX) 50 MG tablet Take 1 tablet (50 mg total) by mouth once for 1 dose. May repeat in 2 hours if headache persists or recurs. 04/13/23 04/13/23  Gustavus Bryant, FNP    Family History Family History  Problem Relation Age of Onset   Hypertension Mother     Social History Social History   Tobacco Use   Smoking status: Former    Current packs/day: 0.00    Types: Cigarettes    Quit date:  07/24/2012    Years since quitting: 11.5    Passive exposure: Past   Smokeless tobacco: Never  Vaping Use   Vaping status: Never Used  Substance Use Topics   Alcohol use: Never   Drug use: No     Allergies   Patient has no known allergies.   Review of Systems Review of Systems  Per HPI   Physical Exam Triage Vital Signs ED Triage Vitals  Encounter Vitals Group     BP 02/09/24 1600 120/78     Systolic BP Percentile --      Diastolic BP Percentile --      Pulse Rate 02/09/24 1600 69     Resp 02/09/24 1600 16     Temp 02/09/24 1600 98.1 F (36.7 C)     Temp Source 02/09/24 1600 Oral     SpO2 02/09/24 1600 96 %     Weight --      Height --      Head Circumference --       Peak Flow --      Pain Score 02/09/24 1559 4     Pain Loc --      Pain Education --      Exclude from Growth Chart --    No data found.  Updated Vital Signs BP 120/78 (BP Location: Right Arm)   Pulse 69   Temp 98.1 F (36.7 C) (Oral)   Resp 16   SpO2 96%   Visual Acuity Right Eye Distance:   Left Eye Distance:   Bilateral Distance:    Right Eye Near:   Left Eye Near:    Bilateral Near:     Physical Exam Vitals and nursing note reviewed.  Constitutional:      Appearance: Normal appearance.  HENT:     Head: Normocephalic and atraumatic.     Right Ear: External ear normal.     Left Ear: External ear normal.     Nose: Nose normal.     Mouth/Throat:     Mouth: Mucous membranes are moist.  Eyes:     Conjunctiva/sclera: Conjunctivae normal.  Cardiovascular:     Rate and Rhythm: Normal rate and regular rhythm.     Heart sounds: Normal heart sounds. No murmur heard. Pulmonary:     Effort: Pulmonary effort is normal. No respiratory distress.     Breath sounds: Normal breath sounds.  Musculoskeletal:        General: Tenderness present. No swelling or signs of injury. Normal range of motion.     Thoracic back: Tenderness present. No swelling or deformity.       Back:     Comments: Mild tenderness to palpation throughout entire mid back, spine without step-off or deformity.  Atraumatic.  Skin:    General: Skin is warm and dry.  Neurological:     General: No focal deficit present.     Mental Status: He is alert.  Psychiatric:        Mood and Affect: Mood normal.        Behavior: Behavior is cooperative.      UC Treatments / Results  Labs (all labs ordered are listed, but only abnormal results are displayed) Labs Reviewed - No data to display  EKG   Radiology No results found.  Procedures Procedures (including critical care time)  Medications Ordered in UC Medications  dexamethasone (DECADRON) injection 10 mg (has no administration in time range)     Initial Impression / Assessment and Plan /  UC Course  I have reviewed the triage vital signs and the nursing notes.  Pertinent labs & imaging results that were available during my care of the patient were reviewed by me and considered in my medical decision making (see chart for details).  Vitals and triage reviewed, patient is hemodynamically stable.  Mid back is mildly tender to palpation and worsens with movement.  Spine without step-off or deformity.  Presentation consistent with muscular pain, will trial muscle relaxers and steroid injection.  Orthopedic follow-up encouraged.  Plan of care, follow-up care return precautions given, no questions at this time.     Final Clinical Impressions(s) / UC Diagnoses   Final diagnoses:  Mid back pain     Discharge Instructions      Continue to wear your back brace.  You can use the muscle relaxer up to 2 times daily as needed, do not drink alcohol or drive on this medication as it may cause drowsiness.  You can take and her milligrams of ibuprofen every 8 hours to help with your pain.  Ensure you are using proper lifting technique.  Follow-up with orthopedic if this continues.  If you develop any chest pain please seek immediate evaluation at the nearest emergency department for further advanced evaluation.    ED Prescriptions     Medication Sig Dispense Auth. Provider   methocarbamol (ROBAXIN) 500 MG tablet Take 1 tablet (500 mg total) by mouth 2 (two) times daily. 20 tablet [place] Fisch[place], [place] N, [place]      PDMP not reviewed this encounter.   Keven Osborn, [place] N, [place] 02/09/24 1633    Flower Franko, [place] N, [place] 02/09/24 [phone]

## 2024-02-09 NOTE — Discharge Instructions (Addendum)
 Continue to wear your back brace.  You can use the muscle relaxer up to 2 times daily as needed, do not drink alcohol or drive on this medication as it may cause drowsiness.  You can take and her milligrams of ibuprofen every 8 hours to help with your pain.  Ensure you are using proper lifting technique.  Follow-up with orthopedic if this continues.  If you develop any chest pain please seek immediate evaluation at the nearest emergency department for further advanced evaluation.

## 2024-06-14 ENCOUNTER — Ambulatory Visit (HOSPITAL_COMMUNITY)
Admission: EM | Admit: 2024-06-14 | Discharge: 2024-06-14 | Disposition: A | Discharge status: Home / Self Care | Attending: Internal Medicine | Admitting: Internal Medicine

## 2024-06-14 DIAGNOSIS — S46911A Strain of unspecified muscle, fascia and tendon at shoulder and upper arm level, right arm, initial encounter: Secondary | ICD-10-CM

## 2024-06-14 MED ORDER — BACLOFEN 10 MG PO TABS: 10.0000 mg | ORAL_TABLET | Freq: Three times a day (TID) | ORAL | 0 refills | Status: AC

## 2024-06-14 MED ORDER — NAPROXEN 500 MG PO TABS: 500.0000 mg | ORAL_TABLET | Freq: Two times a day (BID) | ORAL | 0 refills | Status: AC

## 2024-06-14 NOTE — Discharge Instructions (Addendum)
 Your pain is likely due to a muscle strain which will improve on its own with time.   - Naproxen  every 12 hours as needed for pain of the right shoulder with food. No other NSAIDs while taking this medication (Ibuprofen , goody powders, etc).  - You may also take the prescribed muscle relaxer as directed as needed for muscle aches/spasm.  Do not take this medication and drive or drink alcohol as it can make you sleepy.  Mainly use this medicine at nighttime as needed. - Apply heat 20 minutes on then 20 minutes off and perform gentle range of motion exercises to the area of greatest pain to prevent muscle stiffness and provide further pain relief.   Follow-up with your primary care provider or return to urgent care if your symptoms do not improve in the next 3 to 4 days with medications and interventions recommended today. If your symptoms are severe (red flag), please go to the emergency room.

## 2024-06-14 NOTE — ED Provider Notes (Signed)
 [facility]    CSN: [id number] Arrival date & time: 06/14/24  1424      History   Chief Complaint Chief Complaint  Patient presents with   Shoulder Pain    HPI [name] is a 38 y.o. male.   [name] is a 38 y.o. male presenting for chief complaint of Shoulder Pain that started 2 to 3 weeks ago without injury.  Pain is only elicited with movement of the right upper extremity at the shoulder joint and improved significantly/is completely resolved with rest of the right upper extremity.  Denies previous injuries to the right upper extremity/shoulder.  Pain does not radiate and is localized to the anterior portion of the right shoulder.  He works in [employer] where he is frequently required to perform heavy lifting and repetitive movements with his arms.  Denies radicular pain to the right arm, chest, or neck.  No recent fevers, chills, body aches, rash, or shortness of breath.  He is taking ibuprofen  intermittently which helps take the edge off of the pain.  Additionally reports feeling off and intermittent blurry vision for the last 2 months.  He wears glasses for vision correction and most recently had a routine eye exam 1 year ago.  He has an appointment with the ophthalmologist for eye exam in July.  His blurry vision and off feeling improves after he eats.  He additionally works outside in [employer] and tries to drink plenty of water to stay well-hydrated.  Denies recent syncope, near syncope, chest pain, heart palpitations, dizziness, headache, other vision changes, ear pain, and tinnitus.  Denies polydipsia, polyuria, and history of diabetes. He is not currently experiencing blurry vision at this time.   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     Home Medications    Prior to Admission medications   Medication Sig Start Date End Date Taking? Authorizing Provider  baclofen  (LIORESAL ) 10 MG tablet Take 1 tablet (10 mg total) by mouth 3 (three) times daily. 06/14/24  Yes Enedelia Dorna HERO, FNP  naproxen  (NAPROSYN ) 500 MG tablet Take 1 tablet (500 mg total) by mouth 2 (two) times daily. 06/14/24  Yes Enedelia Dorna HERO, FNP  azelastine  (OPTIVAR ) 0.05 % ophthalmic solution Place 1 drop into both eyes 2 (two) times daily. Patient not taking: Reported on 10/19/2022 04/30/21   Stuart Vernell Norris, PA-C    Family History Family History  Problem Relation Age of Onset   Hypertension Mother     Social History Social History   Tobacco Use   Smoking status: Former    Current packs/day: 0.00    Types: Cigarettes    Quit date: 07/24/2012    Years since quitting: 11.8    Passive exposure: Past   Smokeless tobacco: Never  Vaping Use   Vaping status: Never Used  Substance Use Topics   Alcohol use: Never   Drug use: No     Allergies   Patient has no known allergies.   Review of Systems Review of Systems Per HPI  Physical Exam Triage Vital Signs ED Triage Vitals  Encounter Vitals Group     BP 06/14/24 1520 126/86     Girls Systolic BP Percentile --      Girls Diastolic BP Percentile --      Boys Systolic BP Percentile --      Boys Diastolic BP Percentile --      Pulse Rate 06/14/24 1520 60     Resp 06/14/24 1520 18     Temp 06/14/24 1520 98 F (36.7  C)     Temp Source 06/14/24 1520 Oral     SpO2 06/14/24 1520 98 %     Weight --      Height --      Head Circumference --      Peak Flow --      Pain Score 06/14/24 1519 4     Pain Loc --      Pain Education --      Exclude from Growth Chart --    No data found.  Updated Vital Signs BP 126/86 (BP Location: Right Arm)   Pulse 60   Temp 98 F (36.7 C) (Oral)   Resp 18   SpO2 98%   Visual Acuity Right Eye Distance: 20/25 Left Eye Distance: 20/25 Bilateral Distance: 20/20  Right Eye Near:   Left Eye Near:    Bilateral Near:     Physical Exam Vitals and nursing note reviewed.  Constitutional:      Appearance: He is not ill-appearing or toxic-appearing.  HENT:     Head: Normocephalic and atraumatic.     Right Ear: Hearing and external ear normal.     Left Ear: Hearing and external ear normal.     Nose: Nose normal.     Mouth/Throat:     Lips: Pink.  Eyes:     General: Lids are normal. Vision grossly intact. Gaze aligned appropriately.     Extraocular Movements: Extraocular movements intact.     Conjunctiva/sclera: Conjunctivae normal.  Cardiovascular:     Rate and Rhythm: Normal rate and regular rhythm.     Heart sounds: Normal heart sounds, S1 normal and S2 normal.  Pulmonary:     Effort: Pulmonary effort is normal. No respiratory distress.     Breath sounds: Normal breath sounds and air entry.  Musculoskeletal:     Right shoulder: Normal. No swelling, deformity, effusion, laceration, tenderness (Nontender to palpation of the right shoulder joint, tenderness is only elicited with range of motion activity.  Full range of motion of the right shoulder.), bony tenderness or crepitus. Normal range of motion. Normal strength. Normal pulse.     Left shoulder: Normal.     Cervical back: Neck supple.  Skin:    General: Skin is warm and dry.     Capillary Refill: Capillary refill takes less than 2 seconds.     Findings: No rash.  Neurological:     General: No focal  deficit present.     Mental Status: He is alert and oriented to person, place, and time. Mental status is at baseline.     Cranial Nerves: No dysarthria or facial asymmetry.  Psychiatric:        Mood and Affect: Mood normal.        Speech: Speech normal.  Behavior: Behavior normal.        Thought Content: Thought content normal.        Judgment: Judgment normal.      UC Treatments / Results  Labs (all labs ordered are listed, but only abnormal results are displayed) Labs Reviewed - No data to display  EKG   Radiology No results found.  Procedures Procedures (including critical care time)  Medications Ordered in UC Medications - No data to display  Initial Impression / Assessment and Plan / UC Course  I have reviewed the triage vital signs and the nursing notes.  Pertinent labs & imaging results that were available during my care of the patient were reviewed by me and considered in my medical decision making (see chart for details).   1. Muscle strain of right shoulder Evaluation suggests pain is musculoskeletal in nature. Will manage this with rest, gentle ROM exercises, heat therapy, naproxen  as needed for pain, and as needed use of muscle relaxer. Drowsiness precautions discussed regarding muscle relaxer use. Imaging: no indication for imaging based on stable musculoskeletal exam findings May follow-up with orthopedics as needed. Work/school excise note given.  Blurred vision intermittently is likely a result of episodic hypoglycemia. Advised to avoid going long periods of time without eating.  Follow-up with PCP.  Counseled patient on potential for adverse effects with medications prescribed/recommended today, strict ER and return-to-clinic precautions discussed, patient verbalized understanding.    Final Clinical Impressions(s) / UC Diagnoses   Final diagnoses:  Muscle strain of right shoulder region, initial encounter     Discharge Instructions       Your pain is likely due to a muscle strain which will improve on its own with time.   - Naproxen  every 12 hours as needed for pain of the right shoulder with food. No other NSAIDs while taking this medication (Ibuprofen , goody powders, etc).  - You may also take the prescribed muscle relaxer as directed as needed for muscle aches/spasm.  Do not take this medication and drive or drink alcohol as it can make you sleepy.  Mainly use this medicine at nighttime as needed. - Apply heat 20 minutes on then 20 minutes off and perform gentle range of motion exercises to the area of greatest pain to prevent muscle stiffness and provide further pain relief.   Follow-up with your primary care provider or return to urgent care if your symptoms do not improve in the next 3 to 4 days with medications and interventions recommended today. If your symptoms are severe (red flag), please go to the emergency room.        ED Prescriptions     Medication Sig Dispense Auth. Provider   baclofen  (LIORESAL ) 10 MG tablet Take 1 tablet (10 mg total) by mouth 3 (three) times daily. 30 each Enedelia Dorna HERO, FNP   naproxen  (NAPROSYN ) 500 MG tablet Take 1 tablet (500 mg total) by mouth 2 (two) times daily. 30 tablet Enedelia Dorna HERO, FNP      PDMP not reviewed this encounter.   Enedelia Dorna HERO, [place] 06/14/24 [phone]

## 2024-06-14 NOTE — ED Triage Notes (Signed)
 Pt c/o R shoulder pain x2-3 weeks. Pain has waxed and waned; is worse with ROM. Has hx of similar pain requiring steroid injection. Some relief with motrin .   Pt would also like his blood sugar checked. He states lately he has felt off. Has had some blurred vision. Thought was due to it. No hx of DM.

## 2024-07-03 DIAGNOSIS — M25511 Pain in right shoulder: Secondary | ICD-10-CM | POA: Insufficient documentation

## 2024-09-15 DIAGNOSIS — M75110 Incomplete rotator cuff tear or rupture of unspecified shoulder, not specified as traumatic: Secondary | ICD-10-CM | POA: Insufficient documentation

## 2024-10-20 DIAGNOSIS — Z4789 Encounter for other orthopedic aftercare: Secondary | ICD-10-CM | POA: Insufficient documentation

## 2024-12-25 ENCOUNTER — Ambulatory Visit

## 2024-12-25 VITALS — BP 114/79 | HR 64 | Temp 98.1°F | Resp 16 | Ht 65.75 in | Wt 188.0 lb

## 2024-12-25 DIAGNOSIS — G43009 Migraine without aura, not intractable, without status migrainosus: Secondary | ICD-10-CM | POA: Diagnosis not present

## 2024-12-25 DIAGNOSIS — Z13220 Encounter for screening for lipoid disorders: Secondary | ICD-10-CM | POA: Diagnosis not present

## 2024-12-25 DIAGNOSIS — Z13228 Encounter for screening for other metabolic disorders: Secondary | ICD-10-CM | POA: Diagnosis not present

## 2024-12-25 DIAGNOSIS — Z131 Encounter for screening for diabetes mellitus: Secondary | ICD-10-CM | POA: Diagnosis not present

## 2024-12-25 DIAGNOSIS — Z130 Encounter for screening for diseases of the blood and blood-forming organs and certain disorders involving the immune mechanism: Secondary | ICD-10-CM | POA: Diagnosis not present

## 2024-12-25 DIAGNOSIS — Z0000 Encounter for general adult medical examination without abnormal findings: Secondary | ICD-10-CM

## 2024-12-25 DIAGNOSIS — Z136 Encounter for screening for cardiovascular disorders: Secondary | ICD-10-CM | POA: Diagnosis not present

## 2024-12-25 DIAGNOSIS — Z23 Encounter for immunization: Secondary | ICD-10-CM | POA: Diagnosis not present

## 2024-12-25 MED ORDER — SUMATRIPTAN SUCCINATE 50 MG PO TABS: 50.0000 mg | ORAL_TABLET | ORAL | 0 refills | Status: AC | PRN

## 2024-12-25 NOTE — Addendum Note (Signed)
 Addended by: CELESTIA GUSTAV BRAVO on: 12/25/2024 09:57 AM   Modules accepted: Orders

## 2024-12-25 NOTE — Progress Notes (Signed)
 "    Patient ID: [name], male    DOB: September 26, 1986  MRN: [id number]  CC: No chief complaint on file.   Subjective: [name] is a 39 y.o. male with past medical history of migraines who presents to clinic for annual physical exam. Pt's main concern is recurring migraines at least 2 times a week. Pt rpeorts he has taken sumatriptan  in the past and received relief but he is out of medication.    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/28/2015   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     Medications Ordered Prior to Encounter[1]  Allergies[2]  Social History   Socioeconomic History   Marital status: Married    Spouse name: Rosa   Number of children: 3   Years of education: Not on file   Highest education level: GED or equivalent  Occupational History   Not on file  Tobacco Use   Smoking status: Former    Current packs/day: 0.00    Average packs/day: 0.3 packs/day    Types: Cigarettes    Quit date: 07/24/2012    Years since quitting: 12.4    Passive exposure: Past   Smokeless tobacco: Never  Vaping Use   Vaping status: Never Used  Substance and Sexual Activity   Alcohol use: Never   Drug use: No   Sexual activity: Yes  Other Topics Concern   Not on file  Social History Narrative   Not on file   Social Drivers of Health   Tobacco Use: Medium Risk (12/25/2024)   Patient History    Smoking  Tobacco Use: Former    Smokeless Tobacco Use: Never    Passive Exposure: Past  Physicist, Medical Strain: Low Risk (12/24/2024)   Overall Financial Resource Strain (CARDIA)    Difficulty of Paying Living Expenses: Not very hard  Food Insecurity: No Food Insecurity (12/24/2024)   Epic    Worried About Programme Researcher, Broadcasting/film/video in the Last Year: Never true    Ran Out of Food in the Last Year: Never true  Transportation Needs: No Transportation Needs (12/24/2024)   Epic    Lack of Transportation (Medical): No    Lack of Transportation (Non-Medical): No  Physical Activity: Insufficiently Active (12/24/2024)   Exercise Vital Sign    Days of Exercise per Week: 3 days    Minutes of Exercise per Session: 20 min  Stress: Stress Concern Present (12/24/2024)   [employer] of Occupational Health - Occupational Stress Questionnaire    Feeling of Stress: To some extent  Social Connections: Moderately Integrated (12/24/2024)   Social Connection and Isolation Panel    Frequency of Communication with Friends and Family: Once a week    Frequency of Social Gatherings with Friends and Family: Once a week    Attends Religious Services: More than 4 times per year    Active Member of [employer] or Organizations: Yes    Attends Banker Meetings: More than 4 times  per year    Marital Status: Married  Catering Manager Violence: Not At Risk (12/25/2024)   Epic    Fear of Current or Ex-Partner: No    Emotionally Abused: No    Physically Abused: No    Sexually Abused: No  Depression (PHQ2-9): Low Risk (09/20/2022)   Depression (PHQ2-9)    PHQ-2 Score: 0  Alcohol Screen: Low Risk (12/25/2024)   Alcohol Screen    Last Alcohol Screening Score (AUDIT): 0  Housing: Low Risk (12/25/2024)   Epic    Unable to Pay for Housing in the Last Year: No    Number of Times Moved in the Last Year: 0    Homeless in the Last Year: No  Utilities: Not At Risk (12/25/2024)   Epic    Threatened with loss of utilities: No   Health Literacy: Adequate Health Literacy (12/25/2024)   B1300 Health Literacy    Frequency of need for help with medical instructions: Never    Family History  Problem Relation Age of Onset   Hypertension Mother     No past surgical history on file.  ROS: Review of Systems Negative except as stated above  PHYSICAL EXAM: BP 114/79   Pulse 64   Temp 98.1 F (36.7 C) (Oral)   Resp 16   Ht 5' 5.75 (1.67 m)   Wt 188 lb (85.3 kg)   SpO2 95%   BMI 30.58 kg/m   Physical Exam  General: well-appearing, no acute distress Skin: no jaundice, rashes, or lesions Head: normocephalic, no lesions, no abnormal hair distribution or loss Eyes: anicteric sclera, pupils equally round and reactive to light and accommodation, extraocular movements intact, appropriate visual acuity with corrective lenses Ears: no external lesions, tympanic membrane translucent  Nose: no septal deviation, turbinates clear Throat: trachea midline, no thyromegaly, moist mucus membranes Cardiovascular: regular heart rate and rhythm, normal S1/S2, no murmurs, gallops, or rubs, peripheral pulses 2+ bilaterally Chest: no skeletal deformity, lungs clear to auscultation bilaterally, equal breath sounds bilaterally Abdomen: soft, non-distended, non-tender to palpation, no hepatomegaly, no splenomegaly, normoactive bowel sounds Musculoskeletal: strength of upper and lower extremities equal bilaterally, 5/5, normal gait Extremities: no peripheral edema, nails intact Neurologic: cranial nerves II-XII intact    ASSESSMENT AND PLAN:  1. Encounter for annual wellness visit (Primary) - Complete physical exam performed today, no abnormal findings. - Routine labs to screen for heme, cardiovascular and metabolic disorders.  2. Migraine without aura and without status migrainosus, not intractable - SUMAtriptan  (IMITREX ) 50 MG tablet; Take 1 tablet (50 mg total) by mouth as needed for migraine. May repeat in 2 hours if  headache persists or recurs.  Dispense: 30 tablet; Refill: 0  3. Screening for blood disease - CBC  4. Diabetes mellitus screening - Hemoglobin A1c  5. Encounter for lipid screening for cardiovascular disease - Lipid panel  6. Screening for metabolic disorder - Comprehensive metabolic panel with GFR    Patient was given the opportunity to ask questions.  Patient verbalized understanding of the plan and was able to repeat key elements of the plan. Patient was given clear instructions to go to Emergency Department or return to medical center if symptoms don't improve, worsen, or new problems develop.The patient verbalized understanding.   Orders Placed This Encounter  Procedures   CBC   Comprehensive metabolic panel with GFR   Lipid panel   Hemoglobin A1c     Requested Prescriptions   Signed Prescriptions Disp Refills   SUMAtriptan  (IMITREX ) 50 MG tablet  30 tablet 0    Sig: Take 1 tablet (50 mg total) by mouth as needed for migraine. May repeat in 2 hours if headache persists or recurs.    No follow-ups on file.  Sula Cower Heinz Eckert, PA-C      [1]  Current Outpatient Medications on File Prior to Visit  Medication Sig Dispense Refill   azelastine  (OPTIVAR ) 0.05 % ophthalmic solution Place 1 drop into both eyes 2 (two) times daily. 6 mL 2   baclofen  (LIORESAL ) 10 MG tablet Take 1 tablet (10 mg total) by mouth 3 (three) times daily. 30 each 0   naproxen  (NAPROSYN ) 500 MG tablet Take 1 tablet (500 mg total) by mouth 2 (two) times daily. 30 tablet 0   oxyCODONE (OXY IR/ROXICODONE) 5 MG immediate release tablet TAKE 1 TABLET BY MOUTH EVERY 4 HOURS AS NEEDED FOR MODERATE PAIN     No current facility-administered medications on file prior to visit.  [2] No Known Allergies  "

## 2024-12-26 LAB — COMPREHENSIVE METABOLIC PANEL WITH GFR
ALT: 29 IU/L (ref 0–44)
AST: 22 IU/L (ref 0–40)
Albumin: 4.6 g/dL (ref 4.1–5.1)
Alkaline Phosphatase: 66 IU/L (ref 47–123)
BUN/Creatinine Ratio: 17 (ref 9–20)
BUN: 14 mg/dL (ref 6–20)
Bilirubin Total: 0.4 mg/dL (ref 0.0–1.2)
CO2: 22 mmol/L (ref 20–29)
Calcium: 9.5 mg/dL (ref 8.7–10.2)
Chloride: 104 mmol/L (ref 96–106)
Creatinine, Ser: 0.84 mg/dL (ref 0.76–1.27)
Globulin, Total: 2.5 g/dL (ref 1.5–4.5)
Glucose: 98 mg/dL (ref 70–99)
Potassium: 4 mmol/L (ref 3.5–5.2)
Sodium: 142 mmol/L (ref 134–144)
Total Protein: 7.1 g/dL (ref 6.0–8.5)
eGFR: 114 mL/min/1.73

## 2024-12-26 LAB — LIPID PANEL
Chol/HDL Ratio: 4.1 ratio (ref 0.0–5.0)
Cholesterol, Total: 158 mg/dL (ref 100–199)
HDL: 39 mg/dL — ABNORMAL LOW
LDL Chol Calc (NIH): 103 mg/dL — ABNORMAL HIGH (ref 0–99)
Triglycerides: 83 mg/dL (ref 0–149)
VLDL Cholesterol Cal: 16 mg/dL (ref 5–40)

## 2024-12-26 LAB — CBC
Hematocrit: 48.3 % (ref 37.5–51.0)
Hemoglobin: 15.9 g/dL (ref 13.0–17.7)
MCH: 29.6 pg (ref 26.6–33.0)
MCHC: 32.9 g/dL (ref 31.5–35.7)
MCV: 90 fL (ref 79–97)
Platelets: 351 x10E3/uL (ref 150–450)
RBC: 5.38 x10E6/uL (ref 4.14–5.80)
RDW: 13.2 % (ref 11.6–15.4)
WBC: 7.3 x10E3/uL (ref 3.4–10.8)

## 2024-12-26 LAB — HEMOGLOBIN A1C
Est. average glucose Bld gHb Est-mCnc: 105 mg/dL
Hgb A1c MFr Bld: 5.3 % (ref 4.8–5.6)

## 2024-12-28 ENCOUNTER — Ambulatory Visit: Payer: Self-pay

## 2024-12-28 NOTE — Progress Notes (Signed)
 Normal results. No concerns at this time.

## 2025-01-07 ENCOUNTER — Encounter
